# Patient Record
Sex: Female | Born: 1965 | Race: White | Hispanic: No | Marital: Married | Smoking: Never smoker
Health system: Southern US, Community
[De-identification: ages and names within clinical notes are randomized; demographics above are authoritative.]

## PROBLEM LIST (undated history)

## (undated) DIAGNOSIS — I10 Essential (primary) hypertension: Secondary | ICD-10-CM

## (undated) DIAGNOSIS — M5136 Other intervertebral disc degeneration, lumbar region: Secondary | ICD-10-CM

## (undated) DIAGNOSIS — K760 Fatty (change of) liver, not elsewhere classified: Secondary | ICD-10-CM

## (undated) DIAGNOSIS — M353 Polymyalgia rheumatica: Secondary | ICD-10-CM

## (undated) DIAGNOSIS — E785 Hyperlipidemia, unspecified: Secondary | ICD-10-CM

## (undated) DIAGNOSIS — E781 Pure hyperglyceridemia: Secondary | ICD-10-CM

## (undated) DIAGNOSIS — R748 Abnormal levels of other serum enzymes: Secondary | ICD-10-CM

## (undated) DIAGNOSIS — E559 Vitamin D deficiency, unspecified: Secondary | ICD-10-CM

## (undated) HISTORY — DX: Polymyalgia rheumatica: M35.3

## (undated) HISTORY — DX: Pure hyperglyceridemia: E78.1

## (undated) HISTORY — DX: Other intervertebral disc degeneration, lumbar region: M51.36

## (undated) HISTORY — DX: Fatty (change of) liver, not elsewhere classified: K76.0

## (undated) HISTORY — DX: Vitamin D deficiency, unspecified: E55.9

## (undated) HISTORY — DX: Abnormal levels of other serum enzymes: R74.8

## (undated) HISTORY — DX: Hyperlipidemia, unspecified: E78.5

## (undated) HISTORY — PX: WISDOM TOOTH EXTRACTION: SHX21

---

## 1997-11-08 ENCOUNTER — Other Ambulatory Visit: Admission: RE | Admit: 1997-11-08 | Discharge: 1997-11-08 | Payer: Self-pay | Admitting: Obstetrics and Gynecology

## 1999-01-03 ENCOUNTER — Other Ambulatory Visit: Admission: RE | Admit: 1999-01-03 | Discharge: 1999-01-03 | Payer: Self-pay | Admitting: Obstetrics and Gynecology

## 2000-01-25 ENCOUNTER — Other Ambulatory Visit: Admission: RE | Admit: 2000-01-25 | Discharge: 2000-01-25 | Payer: Self-pay | Admitting: Obstetrics and Gynecology

## 2001-03-13 ENCOUNTER — Other Ambulatory Visit: Admission: RE | Admit: 2001-03-13 | Discharge: 2001-03-13 | Payer: Self-pay | Admitting: Obstetrics and Gynecology

## 2002-05-07 ENCOUNTER — Other Ambulatory Visit: Admission: RE | Admit: 2002-05-07 | Discharge: 2002-05-07 | Payer: Self-pay | Admitting: Obstetrics and Gynecology

## 2002-09-23 ENCOUNTER — Encounter: Admission: RE | Admit: 2002-09-23 | Discharge: 2002-09-23 | Payer: Self-pay | Admitting: Obstetrics and Gynecology

## 2002-09-23 ENCOUNTER — Encounter: Payer: Self-pay | Admitting: Obstetrics and Gynecology

## 2003-07-02 ENCOUNTER — Other Ambulatory Visit: Admission: RE | Admit: 2003-07-02 | Discharge: 2003-07-02 | Payer: Self-pay | Admitting: Obstetrics and Gynecology

## 2004-10-04 ENCOUNTER — Other Ambulatory Visit: Admission: RE | Admit: 2004-10-04 | Discharge: 2004-10-04 | Payer: Self-pay | Admitting: Obstetrics and Gynecology

## 2004-10-16 ENCOUNTER — Encounter: Admission: RE | Admit: 2004-10-16 | Discharge: 2005-01-14 | Payer: Self-pay | Admitting: Obstetrics and Gynecology

## 2005-01-24 ENCOUNTER — Encounter: Admission: RE | Admit: 2005-01-24 | Discharge: 2005-02-25 | Payer: Self-pay | Admitting: Obstetrics and Gynecology

## 2005-11-02 ENCOUNTER — Other Ambulatory Visit: Admission: RE | Admit: 2005-11-02 | Discharge: 2005-11-02 | Payer: Self-pay | Admitting: Obstetrics and Gynecology

## 2006-10-23 ENCOUNTER — Encounter: Admission: RE | Admit: 2006-10-23 | Discharge: 2006-10-23 | Payer: Self-pay | Admitting: Obstetrics and Gynecology

## 2006-12-25 ENCOUNTER — Other Ambulatory Visit: Admission: RE | Admit: 2006-12-25 | Discharge: 2006-12-25 | Payer: Self-pay | Admitting: Obstetrics and Gynecology

## 2007-02-27 DIAGNOSIS — E559 Vitamin D deficiency, unspecified: Secondary | ICD-10-CM

## 2007-02-27 HISTORY — DX: Vitamin D deficiency, unspecified: E55.9

## 2007-12-08 ENCOUNTER — Other Ambulatory Visit: Admission: RE | Admit: 2007-12-08 | Discharge: 2007-12-08 | Payer: Self-pay | Admitting: Obstetrics and Gynecology

## 2007-12-12 ENCOUNTER — Encounter: Admission: RE | Admit: 2007-12-12 | Discharge: 2007-12-12 | Payer: Self-pay | Admitting: Obstetrics and Gynecology

## 2008-11-26 DIAGNOSIS — M5136 Other intervertebral disc degeneration, lumbar region: Secondary | ICD-10-CM

## 2008-11-26 DIAGNOSIS — M51369 Other intervertebral disc degeneration, lumbar region without mention of lumbar back pain or lower extremity pain: Secondary | ICD-10-CM

## 2008-11-26 HISTORY — DX: Other intervertebral disc degeneration, lumbar region without mention of lumbar back pain or lower extremity pain: M51.369

## 2008-11-26 HISTORY — DX: Other intervertebral disc degeneration, lumbar region: M51.36

## 2009-01-05 ENCOUNTER — Encounter: Admission: RE | Admit: 2009-01-05 | Discharge: 2009-01-05 | Payer: Self-pay | Admitting: Obstetrics and Gynecology

## 2010-01-06 ENCOUNTER — Encounter: Admission: RE | Admit: 2010-01-06 | Discharge: 2010-01-06 | Payer: Self-pay | Admitting: Obstetrics and Gynecology

## 2010-01-26 DIAGNOSIS — K760 Fatty (change of) liver, not elsewhere classified: Secondary | ICD-10-CM

## 2010-01-26 HISTORY — DX: Fatty (change of) liver, not elsewhere classified: K76.0

## 2010-05-03 ENCOUNTER — Encounter: Payer: BC Managed Care – PPO | Attending: Endocrinology | Admitting: *Deleted

## 2010-05-03 DIAGNOSIS — E669 Obesity, unspecified: Secondary | ICD-10-CM | POA: Insufficient documentation

## 2010-05-03 DIAGNOSIS — Z713 Dietary counseling and surveillance: Secondary | ICD-10-CM | POA: Insufficient documentation

## 2010-07-28 DIAGNOSIS — E785 Hyperlipidemia, unspecified: Secondary | ICD-10-CM

## 2010-07-28 HISTORY — DX: Hyperlipidemia, unspecified: E78.5

## 2010-08-04 ENCOUNTER — Other Ambulatory Visit: Payer: Self-pay | Admitting: Family Medicine

## 2010-08-04 DIAGNOSIS — R748 Abnormal levels of other serum enzymes: Secondary | ICD-10-CM

## 2010-08-07 ENCOUNTER — Ambulatory Visit
Admission: RE | Admit: 2010-08-07 | Discharge: 2010-08-07 | Disposition: A | Discharge status: Home / Self Care | Payer: BC Managed Care – PPO | Source: Ambulatory Visit | Attending: Family Medicine | Admitting: Family Medicine

## 2010-08-07 DIAGNOSIS — R748 Abnormal levels of other serum enzymes: Secondary | ICD-10-CM

## 2010-12-26 ENCOUNTER — Other Ambulatory Visit: Payer: Self-pay | Admitting: Obstetrics and Gynecology

## 2010-12-26 DIAGNOSIS — Z1231 Encounter for screening mammogram for malignant neoplasm of breast: Secondary | ICD-10-CM

## 2011-02-02 ENCOUNTER — Ambulatory Visit
Admission: RE | Admit: 2011-02-02 | Discharge: 2011-02-02 | Disposition: A | Discharge status: Home / Self Care | Payer: BC Managed Care – PPO | Source: Ambulatory Visit | Attending: Obstetrics and Gynecology | Admitting: Obstetrics and Gynecology

## 2011-02-02 DIAGNOSIS — Z1231 Encounter for screening mammogram for malignant neoplasm of breast: Secondary | ICD-10-CM

## 2011-03-28 ENCOUNTER — Other Ambulatory Visit (HOSPITAL_COMMUNITY): Payer: Self-pay | Admitting: Gastroenterology

## 2011-04-02 ENCOUNTER — Encounter (HOSPITAL_COMMUNITY): Payer: Self-pay | Admitting: Respiratory Therapy

## 2011-04-05 ENCOUNTER — Other Ambulatory Visit: Payer: Self-pay | Admitting: Radiology

## 2011-04-10 ENCOUNTER — Other Ambulatory Visit: Payer: Self-pay | Admitting: Radiology

## 2011-04-13 ENCOUNTER — Encounter (HOSPITAL_COMMUNITY): Payer: Self-pay

## 2011-04-13 ENCOUNTER — Ambulatory Visit (HOSPITAL_COMMUNITY)
Admission: RE | Admit: 2011-04-13 | Discharge: 2011-04-13 | Disposition: A | Discharge status: Home / Self Care | Payer: BC Managed Care – PPO | Source: Ambulatory Visit | Attending: Gastroenterology | Admitting: Gastroenterology

## 2011-04-13 DIAGNOSIS — K759 Inflammatory liver disease, unspecified: Secondary | ICD-10-CM | POA: Insufficient documentation

## 2011-04-13 DIAGNOSIS — I10 Essential (primary) hypertension: Secondary | ICD-10-CM | POA: Insufficient documentation

## 2011-04-13 DIAGNOSIS — R7989 Other specified abnormal findings of blood chemistry: Secondary | ICD-10-CM | POA: Insufficient documentation

## 2011-04-13 DIAGNOSIS — K7689 Other specified diseases of liver: Secondary | ICD-10-CM | POA: Insufficient documentation

## 2011-04-13 DIAGNOSIS — E119 Type 2 diabetes mellitus without complications: Secondary | ICD-10-CM | POA: Insufficient documentation

## 2011-04-13 HISTORY — DX: Essential (primary) hypertension: I10

## 2011-04-13 LAB — CBC
HCT: 37.6 % (ref 36.0–46.0)
MCV: 85.1 fL (ref 78.0–100.0)
Platelets: 304 10*3/uL (ref 150–400)
RBC: 4.42 MIL/uL (ref 3.87–5.11)
RDW: 14.2 % (ref 11.5–15.5)
WBC: 10.8 10*3/uL — ABNORMAL HIGH (ref 4.0–10.5)

## 2011-04-13 LAB — GLUCOSE, CAPILLARY: Glucose-Capillary: 100 mg/dL — ABNORMAL HIGH (ref 70–99)

## 2011-04-13 LAB — APTT: aPTT: 31 seconds (ref 24–37)

## 2011-04-13 LAB — PROTIME-INR: INR: 1 (ref 0.00–1.49)

## 2011-04-13 MED ORDER — SODIUM CHLORIDE 0.9 % IV SOLN
Freq: Once | INTRAVENOUS | Status: AC
  Administered 2011-04-13: 09:00:00 via INTRAVENOUS

## 2011-04-13 MED ORDER — FENTANYL CITRATE 0.05 MG/ML IJ SOLN
INTRAMUSCULAR | Status: AC
  Filled 2011-04-13: qty 4

## 2011-04-13 MED ORDER — MIDAZOLAM HCL 5 MG/5ML IJ SOLN
INTRAMUSCULAR | Status: DC | PRN
  Administered 2011-04-13: 2 mg via INTRAVENOUS
  Administered 2011-04-13: 1 mg via INTRAVENOUS

## 2011-04-13 MED ORDER — MIDAZOLAM HCL 2 MG/2ML IJ SOLN
INTRAMUSCULAR | Status: AC
  Filled 2011-04-13: qty 4

## 2011-04-13 MED ORDER — FENTANYL CITRATE 0.05 MG/ML IJ SOLN
INTRAMUSCULAR | Status: DC | PRN
  Administered 2011-04-13: 25 ug via INTRAVENOUS
  Administered 2011-04-13 (×2): 50 ug via INTRAVENOUS

## 2011-04-13 NOTE — H&P (Signed)
Meghan Lyons is an 46 y.o. female.   Chief Complaint: elevated liver function tests HPI: scheduled for liver core biopsy  Past Medical History  Diagnosis Date  . Hypertension   . Diabetes mellitus     History reviewed. No pertinent past surgical history.  History reviewed. No pertinent family history. Social History:  does not have a smoking history on file. She does not have any smokeless tobacco history on file. Her alcohol and drug histories not on file.  Allergies:  Allergies  Allergen Reactions  . Keflex Swelling and Rash    Medications Prior to Admission  Medication Sig Dispense Refill  . lisinopril-hydrochlorothiazide (PRINZIDE,ZESTORETIC) 20-25 MG per tablet Take 1 tablet by mouth daily.      . metFORMIN (GLUCOPHAGE) 500 MG tablet Take 500 mg by mouth 2 (two) times daily with a meal.      . naproxen sodium (ANAPROX) 220 MG tablet Take 220 mg by mouth 2 (two) times daily with a meal.      . norethindrone (MICRONOR,CAMILA,ERRIN) 0.35 MG tablet Take 1 tablet by mouth daily.      . Vitamin D, Ergocalciferol, (DRISDOL) 50000 UNITS CAPS Take 50,000 Units by mouth every 14 (fourteen) days.       Medications Prior to Admission  Medication Dose Route Frequency Provider Last Rate Last Dose  . 0.9 %  sodium chloride infusion   Intravenous Once Robet Leu, PA 20 mL/hr at 04/13/11 1191      No results found for this or any previous visit (from the past 48 hour(s)). No results found.  Review of Systems  Constitutional: Negative for fever.  Respiratory: Negative for cough.   Cardiovascular: Negative for chest pain.  Gastrointestinal: Negative for nausea, vomiting and abdominal pain.  Neurological: Negative for headaches.    Blood pressure 136/92, pulse 76, temperature 98 F (36.7 C), temperature source Oral, resp. rate 18, height 5\' 5"  (1.651 m), weight 217 lb (98.431 kg), SpO2 97.00%. Physical Exam  Constitutional: She is oriented to person, place, and time. She  appears well-developed and well-nourished.  HENT:  Head: Normocephalic.  Eyes: EOM are normal.  Neck: Normal range of motion.  Cardiovascular: Normal rate, regular rhythm and normal heart sounds.   No murmur heard. Respiratory: Effort normal and breath sounds normal. She has no wheezes.  GI: Soft. Bowel sounds are normal. There is no tenderness.  Musculoskeletal: Normal range of motion.  Neurological: She is alert and oriented to person, place, and time.  Skin: Skin is warm and dry.     Assessment/Plan Elevated liver functions Scheduled for liver core biopsy Pt aware of procedure benefits and risks and agreeable to proceed. Consent signed.  Olie Scaffidi A 04/13/2011, 9:43 AM

## 2011-04-13 NOTE — H&P (Signed)
Cosigning.

## 2011-04-13 NOTE — Progress Notes (Signed)
Beckey Downing PA advised of patients abdominal cramping.  Order for pain med received, but patient wanted to wait and see if she felt better after urinating.  1340 pt ambulated to bathroom, voided qs and states she is pain free.  She does not wish to take the pain medication.

## 2011-04-13 NOTE — Procedures (Signed)
Technically successful US guided biopsy of right lobe of liver.  No immediate complications.   

## 2011-04-13 NOTE — Discharge Instructions (Signed)
Liver Biopsy Care After Refer to this sheet in the next few weeks. These discharge instructions provide you with general information on caring for yourself after you leave the hospital. Your caregiver may also give you specific instructions. Your treatment has been planned according to the most current medical practices available, but unavoidable complications sometimes occur. If you have any problems or questions after discharge, please call your caregiver. HOME CARE INSTRUCTIONS   You should rest for 1 to 2 days or as instructed.   If you go home the same day as your procedure (outpatient), have a responsible adult take you home and stay with you overnight.   Do not lift more than 5 pounds or play contact sports for 2 weeks.   Do not drive for 24 hours after this test.   Do not take medicine containing aspirin or drink alcohol for 1 week after this test.   Change bandages (dressings) as directed.   Only take over-the-counter or prescription medicines for pain, discomfort, or fever as directed by your caregiver.  OBTAINING YOUR TEST RESULTS Not all test results are available during your visit. If your test results are not back during the visit, make an appointment with your caregiver to find out the results. Do not assume everything is normal if you have not heard from your caregiver or the medical facility. It is important for you to follow up on all of your test results. SEEK MEDICAL CARE IF:   You have increased bleeding (more than a small spot) from the biopsy site.   You have redness, swelling, or increasing pain in the biopsy site.   You have an oral temperature above 102 F (38.9 C).  SEEK IMMEDIATE MEDICAL CARE IF:   You develop swelling or pain in the belly (abdomen).   You develop a rash.   You have difficulty breathing, feel short of breath, or feel faint.   You develop any reaction or side effects to medicines given.  MAKE SURE YOU:   Understand these instructions.    Will watch your condition.   Will get help right away if you are not doing well or get worse.  Document Released: 09/01/2004 Document Revised: 10/25/2010 Document Reviewed: 09/25/2007 ExitCare Patient Information 2012 [employer]. 

## 2011-04-17 ENCOUNTER — Telehealth (HOSPITAL_COMMUNITY): Payer: Self-pay

## 2011-05-21 ENCOUNTER — Ambulatory Visit (INDEPENDENT_AMBULATORY_CARE_PROVIDER_SITE_OTHER): Payer: BC Managed Care – PPO | Admitting: Gastroenterology

## 2011-05-21 ENCOUNTER — Ambulatory Visit
Admission: RE | Admit: 2011-05-21 | Discharge: 2011-05-21 | Disposition: A | Discharge status: Home / Self Care | Payer: BC Managed Care – PPO | Source: Ambulatory Visit | Attending: Gastroenterology | Admitting: Gastroenterology

## 2011-05-21 ENCOUNTER — Other Ambulatory Visit: Payer: Self-pay | Admitting: Gastroenterology

## 2011-05-21 DIAGNOSIS — R945 Abnormal results of liver function studies: Secondary | ICD-10-CM

## 2011-06-28 ENCOUNTER — Other Ambulatory Visit: Payer: Self-pay | Admitting: Internal Medicine

## 2011-06-28 DIAGNOSIS — M25519 Pain in unspecified shoulder: Secondary | ICD-10-CM

## 2011-07-04 ENCOUNTER — Ambulatory Visit
Admission: RE | Admit: 2011-07-04 | Discharge: 2011-07-04 | Disposition: A | Discharge status: Home / Self Care | Payer: BC Managed Care – PPO | Source: Ambulatory Visit | Attending: Internal Medicine | Admitting: Internal Medicine

## 2011-07-04 DIAGNOSIS — M25519 Pain in unspecified shoulder: Secondary | ICD-10-CM

## 2011-07-16 ENCOUNTER — Other Ambulatory Visit: Payer: Self-pay | Admitting: Gastroenterology

## 2011-07-16 DIAGNOSIS — R945 Abnormal results of liver function studies: Secondary | ICD-10-CM

## 2011-07-19 ENCOUNTER — Other Ambulatory Visit: Payer: BC Managed Care – PPO

## 2011-07-26 ENCOUNTER — Ambulatory Visit
Admission: RE | Admit: 2011-07-26 | Discharge: 2011-07-26 | Disposition: A | Discharge status: Home / Self Care | Payer: BC Managed Care – PPO | Source: Ambulatory Visit | Attending: Gastroenterology | Admitting: Gastroenterology

## 2011-07-26 DIAGNOSIS — R945 Abnormal results of liver function studies: Secondary | ICD-10-CM

## 2011-07-26 MED ORDER — GADOBENATE DIMEGLUMINE 529 MG/ML IV SOLN
20.0000 mL | Freq: Once | INTRAVENOUS | Status: AC | PRN
  Administered 2011-07-26: 20 mL via INTRAVENOUS

## 2012-01-07 ENCOUNTER — Other Ambulatory Visit: Payer: Self-pay | Admitting: Obstetrics and Gynecology

## 2012-01-07 DIAGNOSIS — Z1231 Encounter for screening mammogram for malignant neoplasm of breast: Secondary | ICD-10-CM

## 2012-02-22 ENCOUNTER — Ambulatory Visit
Admission: RE | Admit: 2012-02-22 | Discharge: 2012-02-22 | Disposition: A | Discharge status: Home / Self Care | Payer: BC Managed Care – PPO | Source: Ambulatory Visit | Attending: Obstetrics and Gynecology | Admitting: Obstetrics and Gynecology

## 2012-02-22 DIAGNOSIS — Z1231 Encounter for screening mammogram for malignant neoplasm of breast: Secondary | ICD-10-CM

## 2012-02-22 LAB — HM MAMMOGRAPHY

## 2012-06-10 ENCOUNTER — Ambulatory Visit (INDEPENDENT_AMBULATORY_CARE_PROVIDER_SITE_OTHER): Payer: BC Managed Care – PPO | Admitting: Nurse Practitioner

## 2012-06-10 ENCOUNTER — Encounter: Payer: Self-pay | Admitting: Nurse Practitioner

## 2012-06-10 ENCOUNTER — Telehealth: Payer: Self-pay | Admitting: Nurse Practitioner

## 2012-06-10 VITALS — BP 110/76 | HR 72 | Resp 16 | Ht 65.0 in | Wt 183.0 lb

## 2012-06-10 DIAGNOSIS — N921 Excessive and frequent menstruation with irregular cycle: Secondary | ICD-10-CM

## 2012-06-10 MED ORDER — NORETHINDRONE ACETATE 5 MG PO TABS: 5.0000 mg | ORAL_TABLET | ORAL | Status: DC

## 2012-06-10 MED ORDER — FLUCONAZOLE 150 MG PO TABS: 150.0000 mg | ORAL_TABLET | Freq: Once | ORAL | Status: DC

## 2012-06-10 NOTE — Telephone Encounter (Signed)
Left message for patient to return call and make appt. Today. sue

## 2012-06-10 NOTE — Telephone Encounter (Signed)
Patient called to as about medication for yeast infection. Thinks it might not have gotten sent in due to talking about the other medicaiton for bleeding. Please send to [facility]. Fannie Knee  ?Diflucaon

## 2012-06-10 NOTE — Patient Instructions (Addendum)
Aygestin 5 mg   DIRECTIONS:    3 tabs am and pm until bleeding stops Then 10 mg (2 tabs) am and pm for 2 days Then 10mg  (2 tabs) daily until pelvic ultrasound   Continue with Micronor daily as usual  You will get a call about Pelvic Ultrasound Scheduling  HGB 12.5

## 2012-06-10 NOTE — Telephone Encounter (Signed)
Thanks Sue

## 2012-06-10 NOTE — Progress Notes (Signed)
Subjective:     Patient ID: Meghan Lyons, female   DOB: 09/11/1965, 47 y.o.   MRN: [id number]  HPI Comments: Patient had 1 week early  menses on 3/24- 05/30/12 on Micronor POP.  She then spotted after sexual activity about April 7 th with light bleeding X 1 day. They had been working outside trying to clean up fallen debris after the ice strom and she felt very itchy.  She then treated yeast symptoms with OTC Monistat 1 day treatment.  Then started bleeding again now X 4 days.  Usually on Micronor has cycle every 28 days lasting 6 days.  Heavy for 2 days then light.    Review of Systems  Constitutional: Positive for activity change.       Outside working to clean up fallen debris  Respiratory: Negative.   Cardiovascular: Negative.   Gastrointestinal: Negative.  Negative for nausea, vomiting, abdominal pain, diarrhea, constipation, blood in stool and abdominal distention.  Genitourinary: Positive for vaginal bleeding and menstrual problem. Negative for dysuria, urgency, frequency, difficulty urinating, genital sores, vaginal pain, pelvic pain and dyspareunia.       Menses usually on POP is regular. Changed to Micronor 01/2010 secondary to elevated BP. On OCP menses were moderate to light. Since then heavier flow. No missed or late pills.  Musculoskeletal: Positive for back pain.       History of low back pain and MRI in past shows DDD with nerve root irritation.  Most recently l st of year had numbness top of left leg.  Back pain has not changed in character with this bleeding pattern.  Neurological: Positive for numbness.       Objective:   Physical Exam  Constitutional: She is oriented to person, place, and time. She appears well-developed and well-nourished.  Cardiovascular: Normal rate.   Pulmonary/Chest: Effort normal and breath sounds normal.  Abdominal: Soft. She exhibits no distension and no mass. There is no tenderness. There is no rebound and no guarding.  Genitourinary:   Moderate to heavy vaginal bleeding with clots.  No obvious yeast infection but wet prep is not done due to heavy bleeding. Uterus is not enlarged and no pain on bimanual exam.  Neurological: She is alert and oriented to person, place, and time.  Skin: Skin is warm and dry.  Psychiatric: She has a normal mood and affect. Her behavior is normal. Judgment and thought content normal.   UPT neg HGB 12.5    Assessment:      AUB on Progesterone only pills  Recent yeast vaginitis    Plan:      Consulted with Dr. Leda Quail  Will start on Aygestin 5 mg. 3 tabs BID until bleeding stops, then 10 mg am and pm for 2 days, then 10 mg daily until PUS.    Continue with Micronor daily.  Will add Diflucan X 2 tabs as directed to help with yeast symptoms  Will schedule PUS and follow

## 2012-06-10 NOTE — Telephone Encounter (Signed)
pt thinks she may have a yeast infection and she is having some  bleeding

## 2012-06-10 NOTE — Telephone Encounter (Signed)
Patient has a question about prescription from visit.

## 2012-06-10 NOTE — Telephone Encounter (Signed)
Medication for diflucon sent to pharmacy. Patient notified. sue

## 2012-06-11 ENCOUNTER — Telehealth: Payer: Self-pay | Admitting: *Deleted

## 2012-06-11 NOTE — Telephone Encounter (Signed)
Spoke with pharmacy.  Megace 20mg  bid until bleeding stops, then qd called in.  Left msg for pt.  Cost is much lower.

## 2012-06-11 NOTE — Telephone Encounter (Signed)
Patient saw Shirlyn Goltz yesterday for bleeding and yeast inf. pateint called today and states medication , Aygestin 5mg  cost $120.00 for the amount of 60. Ask could something else be called in or a less amount which might make cost less>? Please advise. sue

## 2012-06-11 NOTE — Addendum Note (Signed)
Addended by: Jerene Bears on: 06/11/2012 06:17 PM   Modules accepted: Orders

## 2012-06-12 LAB — HEMOGLOBIN, FINGERSTICK: Hemoglobin, fingerstick: 12.5 g/dL (ref 12.0–16.0)

## 2012-06-12 NOTE — Progress Notes (Signed)
Encounter reviewed by Dr. Brook Silva.  

## 2012-06-16 NOTE — Telephone Encounter (Signed)
Returning [name]'s call to schedule an ultrasound/[facility] email also sent to [employer] with MRN#/Golden

## 2012-06-23 ENCOUNTER — Other Ambulatory Visit: Payer: Self-pay | Admitting: Obstetrics and Gynecology

## 2012-06-23 DIAGNOSIS — N921 Excessive and frequent menstruation with irregular cycle: Secondary | ICD-10-CM

## 2012-06-25 ENCOUNTER — Ambulatory Visit (INDEPENDENT_AMBULATORY_CARE_PROVIDER_SITE_OTHER): Payer: BC Managed Care – PPO | Admitting: Obstetrics and Gynecology

## 2012-06-25 ENCOUNTER — Encounter: Payer: Self-pay | Admitting: Obstetrics and Gynecology

## 2012-06-25 ENCOUNTER — Ambulatory Visit (INDEPENDENT_AMBULATORY_CARE_PROVIDER_SITE_OTHER): Payer: BC Managed Care – PPO

## 2012-06-25 VITALS — BP 120/74

## 2012-06-25 DIAGNOSIS — N921 Excessive and frequent menstruation with irregular cycle: Secondary | ICD-10-CM

## 2012-06-25 DIAGNOSIS — N920 Excessive and frequent menstruation with regular cycle: Secondary | ICD-10-CM

## 2012-06-25 NOTE — Patient Instructions (Addendum)

## 2012-06-25 NOTE — Progress Notes (Signed)
Subjective  Patient here for pelvic ultrasound.  47 year old G2P2 female presents in follow up for heavy vaginal bleeding episode.  Patient has been on Camilla OCP since 2011 for contraception.  Started Camilla due to HTN.  Menses in February was prolonged, lasting 8 days.  Then in March lasted 2 weeks.  Had a week of no bleeding and then started heavy bleeding following intercourse.  No missed or late pills.  Had heavy bleeding and was seen in office and was treated with Megace.  This stopped the bleeding.  Patient states some difficulty in initiating urine stream, which coincided with bleeding.  This has essentially resolved.   Has herniated disc of the L4 and 5.  Notes increased back pain.  Was worried this is related to the bleeding episode.  Objective  See ultrasound findings below.  Normal uterus and ovaries.  Assessment  Episode of menorrhagia on Camilla OCP. Normal pelvic ultrasound with thin endometrial lining. OK to stop Megace.  Patient knows that she may expect some bleeding. OK to continue with [facility] If heavy bleeding recurs, needs endometrial biopsy. I discussed a progesterone IUD, endometrial ablation, and hysterectomy as possible treatment options if bleeding recurs and has a normal future endometrial biopsy.

## 2012-08-15 ENCOUNTER — Ambulatory Visit (INDEPENDENT_AMBULATORY_CARE_PROVIDER_SITE_OTHER): Payer: BC Managed Care – PPO | Admitting: Obstetrics & Gynecology

## 2012-08-15 VITALS — BP 140/80 | HR 84 | Resp 16 | Ht 65.0 in | Wt 183.2 lb

## 2012-08-15 DIAGNOSIS — N920 Excessive and frequent menstruation with regular cycle: Secondary | ICD-10-CM

## 2012-08-15 NOTE — Patient Instructions (Addendum)

## 2012-08-16 ENCOUNTER — Encounter: Payer: Self-pay | Admitting: Obstetrics & Gynecology

## 2012-08-16 DIAGNOSIS — N920 Excessive and frequent menstruation with regular cycle: Secondary | ICD-10-CM | POA: Insufficient documentation

## 2012-08-16 NOTE — Progress Notes (Signed)
47 y.o. Married [place] female G2P2 here with three months history of heavy bleeding during cycles.  She is on micronor for birth control but she does not think it is working well for her.  She does have a history of hypertension and of diabetes.  She has lost 40 pounds over the last several months and reports she may be taken off her metformin.  She was seen in April by Dr. Edward Jolly due to this bleeding.  An ultrasound was performed.  No abnormal findings were noted.  She was started on Megace which helped her cycle.  She used the Megace again in May and then June.  She called again to report the heavy bleeding and appointment was recommended.  She has mild cramping with cycles.  She has no urinary symptoms and no bowel issues.  She denies fever, vaginal discharge or pelvic pain.  Patient is sexually active.    ROS: All ROS questions are negative except as per HPI  Past Medical History  Diagnosis Date  . Hypertension   . Fatty liver 12/11    bx revealed--granuloma  . Hyperlipidemia 07/2010  . Vitamin D deficiency disease 2009  . DDD (degenerative disc disease), lumbar 11/2008  . Diabetes mellitus 03/2010    Dr. Talmage Nap  . Polymyalgia     Exam:   BP 140/80  Pulse 84  Resp 16  Ht 5\' 5"  (1.651 m)  Wt 183 lb 3.2 oz (83.099 kg)  BMI 30.49 kg/m2  LMP 08/07/2012    General appearance: alert and cooperative Abdomen:abdomen is soft without significant tenderness, masses, organomegaly or guarding Skin: Skin color, texture, turgor normal. Lymph nodes: No abnormal inguinal nodes palpated Neurologic: Grossly normal  Pelvic: Pelvic Exam Female: External genitalia: normal general appearance Vaginal: normal mucosa without prolapse or lesions Cervix: normal appearance Adnexa: normal bimanual exam Uterus: normal single, nontender  Endometrial biopsy recommended.  Discussed with patient.  Risks discussed.  Verbal consent obtained.   Procedure:  Speculum placed.  Cervix visualized and cleansed with  betadine prep.  A single toothed tenaculum was applied to the anterior lip of the cervix.  Dilation of cervix was required.  Anterior lip of cervix grasped with single toothed tenaculum at 12 o'clock.  Cervix dilated with Milex dilator.  Endometrial pipelle was advanced through the cervix into the endometrial cavity without difficulty.  Pipelle passed to 7.5cm.  Suction applied and pipelle removed with good tissue sample obtained.  Sampling performed twice. Tenculum removed.  No bleeding noted.  Patient tolerated procedure well.  Assessment:  menorrhagia on micronor with normal ultrasound  Plan:  endometrial biopsy results pending.  Patient does have Megace on hand at home if bleeding starts again before biopsy results are finalized.

## 2012-09-24 ENCOUNTER — Telehealth: Payer: Self-pay | Admitting: Obstetrics & Gynecology

## 2012-09-24 NOTE — Telephone Encounter (Signed)
Patient needs her Megestrol refilled .   [facility] [phone]

## 2012-09-25 ENCOUNTER — Other Ambulatory Visit: Payer: Self-pay

## 2012-09-25 MED ORDER — MEGESTROL ACETATE 20 MG PO TABS: 20.0000 mg | ORAL_TABLET | Freq: Every day | ORAL | Status: DC

## 2012-09-25 NOTE — Telephone Encounter (Signed)
Pt requesting magestrol acetate 40mg . Pt was on 20mg  bid until bleeding stops & then daily per note in chart by triage nurse. Then had endo bx done by Leda Quail on 08/15/12 & report came back. Per note in epic it just said that patient would decide about her options but no further info. Please approve or deny rx.Not sure what dosage patient should be taking if she should be taking it at all. Last aex was 02/11/12

## 2012-09-25 NOTE — Telephone Encounter (Signed)
2nd note dated 09/25/12 routed to Dr. Hyacinth Meeker.  Closing this encounter.

## 2012-09-25 NOTE — Telephone Encounter (Signed)
Called patient.  Rx to pharmacy.  She is going to call when she gets back from beach and scheduled endometrial ablation.

## 2012-09-25 NOTE — Telephone Encounter (Signed)
Patient called regarding Prescription . Leaving for the beach tomorrow. Wanted to know if it had been called in .

## 2012-09-30 ENCOUNTER — Telehealth: Payer: Self-pay | Admitting: *Deleted

## 2012-09-30 NOTE — Telephone Encounter (Signed)
Fax From: [facility] for Megestrol Acetate 40 mg 09/25/12 Megestrol 20 mg #30/0rfs was sent  Aex Scheduled: 02/12/13  S/w "Belenda Cruise" at [facility] she said she hadn't received that rx gave them a verbal rx over the phone  Megestrol 20 mg 1 p.o daily #30/0rf's

## 2012-10-06 ENCOUNTER — Telehealth: Payer: Self-pay | Admitting: *Deleted

## 2012-10-06 NOTE — Telephone Encounter (Signed)
Call to patient to discuss date preferences for procedure. LMTCB.

## 2012-10-10 ENCOUNTER — Other Ambulatory Visit: Payer: Self-pay | Admitting: Obstetrics & Gynecology

## 2012-10-10 DIAGNOSIS — N920 Excessive and frequent menstruation with regular cycle: Secondary | ICD-10-CM

## 2012-10-14 NOTE — Telephone Encounter (Signed)
Call to patient to see about dates requested for surgery.  She states she wants to wait until [facility] appt this Thursday with Dr Hyacinth Meeker and then decide.

## 2012-10-16 ENCOUNTER — Encounter: Payer: Self-pay | Admitting: Obstetrics & Gynecology

## 2012-10-16 ENCOUNTER — Other Ambulatory Visit: Payer: Self-pay | Admitting: Obstetrics & Gynecology

## 2012-10-16 ENCOUNTER — Ambulatory Visit (INDEPENDENT_AMBULATORY_CARE_PROVIDER_SITE_OTHER): Payer: BC Managed Care – PPO

## 2012-10-16 ENCOUNTER — Ambulatory Visit (INDEPENDENT_AMBULATORY_CARE_PROVIDER_SITE_OTHER): Payer: BC Managed Care – PPO | Admitting: Obstetrics & Gynecology

## 2012-10-16 DIAGNOSIS — N920 Excessive and frequent menstruation with regular cycle: Secondary | ICD-10-CM

## 2012-10-16 NOTE — Progress Notes (Signed)
47 y.o.Marriedfemale here for a pelvic ultrasound with sonohystogram.  Patient has hx of menorrhagia that has worsened over the past several months.  She had a PUS with Dr. Edward Jolly in April.  She returned for an endometrial biopsy with me on 08/15/12 which was negative.  She is using Megace during her cycle on the heaviest days in addition to [employer].  She has not yet decided about treatment options but is leaning towards an ablation.    Indication: menorrhagia  Contraception:  micronor  Technique:  Both transabdominal and transvaginal ultrasound  examinations of the pelvis were performed. Transabdominal technique  was performed for global imaging of the pelvis including uterus,  ovaries, adnexal regions, and pelvic cul-de-sac.  It was necessary to proceed with endovaginal exam following the abdominal ultrasound transabdominal exam to visualize the endometrium and adnexa.  Color and duplex Doppler ultrasound was utilized to evaluate blood  flow to the ovaries.    FINDINGS: UTERUS: 7.0 x 3.8 x 3.0cm EMS: 3.13mm ADNEXA: left ovary 2.3 x 1.3 x 1.5cm, right ovary 2.0 x 0.9 x 0.9cm CUL DE SAC: neg  SHSG:  After obtaining appropriate verbal consent from patient, the cervix was visualized using a speculum, and prepped with betadine.  A tenaculum  was not applied to the cervix.  Dilation of the cervix was not necessary. The catheter was passed into the uterus and sterile saline introduced, with the following findings: no intracavitary lesions noted  D/W pt findings.  She really has not decided what to do for treatment.  Endometrial ablation, hospital stay, recovery, risks including failure rate, need for BTL, DVT/PE, uterine perforation with risk of bowel/bladder/ureteral injury all discussed.  IUD use, placement, length of use, risks discussed.  Hysterectomy discussed including hospital stay, risks, recovery/return to work.  She was given brochures again today.  She will consider and let me know.     Assessment:  Menorrhagia, normal u/s, h/o Cearean section x 2.  Need for BC.  Plan:  Patient is deciding on options.  We will check IUD coverage and let pt know.  ~25 minutes spent with patient >50% of time was in face to face discussion of above.

## 2012-10-16 NOTE — Patient Instructions (Addendum)
Please call once you decide what you want to do for bleeding.

## 2012-11-12 ENCOUNTER — Other Ambulatory Visit: Payer: Self-pay | Admitting: Neurosurgery

## 2012-11-12 DIAGNOSIS — M4310 Spondylolisthesis, site unspecified: Secondary | ICD-10-CM

## 2012-11-13 ENCOUNTER — Ambulatory Visit
Admission: RE | Admit: 2012-11-13 | Discharge: 2012-11-13 | Disposition: A | Discharge status: Home / Self Care | Payer: BC Managed Care – PPO | Source: Ambulatory Visit | Attending: Neurosurgery | Admitting: Neurosurgery

## 2012-11-13 DIAGNOSIS — M4310 Spondylolisthesis, site unspecified: Secondary | ICD-10-CM

## 2012-11-19 HISTORY — PX: LUMBAR SPINE SURGERY: SHX701

## 2012-11-25 ENCOUNTER — Telehealth: Payer: Self-pay | Admitting: Orthopedic Surgery

## 2012-11-25 NOTE — Telephone Encounter (Signed)
Spoke with pt to inquire if she might like to see Dr. Hyacinth Meeker for her upcoming Aex in December. Pt had expressed interest in having an endometrial ablation done back in July, and Dr. Hyacinth Meeker thought it might be a good idea for her to become more familiar with her in case she decides to have it done. Pt says she will think about it and let us know. "I really love Patty, although Dr. Hyacinth Meeker is sweet too." Advised we have appts available on the same day for SM. Pt will let us know soon.

## 2013-01-01 ENCOUNTER — Other Ambulatory Visit: Payer: Self-pay

## 2013-02-02 ENCOUNTER — Other Ambulatory Visit: Payer: Self-pay

## 2013-02-02 DIAGNOSIS — Z1231 Encounter for screening mammogram for malignant neoplasm of breast: Secondary | ICD-10-CM

## 2013-02-05 ENCOUNTER — Telehealth: Payer: Self-pay | Admitting: Obstetrics & Gynecology

## 2013-02-05 NOTE — Telephone Encounter (Signed)
LMTCB to schedule IUD insertion

## 2013-02-05 NOTE — Telephone Encounter (Signed)
Patient was to have a Mirena IUD inserted but is no longer experiencing symptoms and has decided to hold off.

## 2013-02-09 ENCOUNTER — Telehealth: Payer: Self-pay | Admitting: *Deleted

## 2013-02-09 NOTE — Telephone Encounter (Signed)
Call to patient to discuss having AEX with Dr Hyacinth Meeker so she could also follow up on cycle issues since patient had previously wanted Novasure. LMTCB.

## 2013-02-12 ENCOUNTER — Encounter: Payer: Self-pay | Admitting: Nurse Practitioner

## 2013-02-12 ENCOUNTER — Ambulatory Visit (INDEPENDENT_AMBULATORY_CARE_PROVIDER_SITE_OTHER): Payer: BC Managed Care – PPO | Admitting: Nurse Practitioner

## 2013-02-12 VITALS — BP 136/88 | HR 64 | Resp 16 | Ht 65.25 in | Wt 199.0 lb

## 2013-02-12 DIAGNOSIS — Z0000 Encounter for general adult medical examination without abnormal findings: Secondary | ICD-10-CM

## 2013-02-12 DIAGNOSIS — E559 Vitamin D deficiency, unspecified: Secondary | ICD-10-CM

## 2013-02-12 DIAGNOSIS — R339 Retention of urine, unspecified: Secondary | ICD-10-CM

## 2013-02-12 DIAGNOSIS — Z01419 Encounter for gynecological examination (general) (routine) without abnormal findings: Secondary | ICD-10-CM

## 2013-02-12 LAB — POCT URINALYSIS DIPSTICK
Bilirubin, UA: NEGATIVE
Glucose, UA: NEGATIVE
Ketones, UA: NEGATIVE
Leukocytes, UA: NEGATIVE
Nitrite, UA: NEGATIVE
pH, UA: 5

## 2013-02-12 LAB — HEMOGLOBIN, FINGERSTICK: Hemoglobin, fingerstick: 12.1 g/dL (ref 12.0–16.0)

## 2013-02-12 MED ORDER — VITAMIN D (ERGOCALCIFEROL) 1.25 MG (50000 UNIT) PO CAPS: 50000.0000 [IU] | ORAL_CAPSULE | ORAL | Status: DC

## 2013-02-12 MED ORDER — MEGESTROL ACETATE 20 MG PO TABS: 20.0000 mg | ORAL_TABLET | Freq: Every day | ORAL | Status: DC | PRN

## 2013-02-12 MED ORDER — NORETHINDRONE 0.35 MG PO TABS: 1.0000 | ORAL_TABLET | Freq: Every day | ORAL | Status: DC

## 2013-02-12 NOTE — Telephone Encounter (Signed)
Patient never returned call and came today for AEX with Patty.  Any further follow-up regarding Novasure?  Pre-EPIC chart to your office.

## 2013-02-12 NOTE — Patient Instructions (Signed)

## 2013-02-12 NOTE — Progress Notes (Signed)
Reviewed personally.  M. Suzanne Aniaya Bacha, MD.  

## 2013-02-12 NOTE — Telephone Encounter (Signed)
No.  Looks like cycles are normal again.  She knows to call if bleeding changes.  Thanks.  Encounter closed.

## 2013-02-12 NOTE — Progress Notes (Signed)
Patient ID: Meghan Lyons, female   DOB: May 09, 1965, 47 y.o.   MRN: [id number] 47 y.o. G2P2 Married Caucasian Fe here for annual exam.  Menses have been heavy for several months starting in March through September.  Menses was irregular with prolonged bleeding and heavy cycles.  Had clots without cramps.  She was treated with Megace and PUS was normal.  She was anticipating HTA.  She then had back surgery in September with fusion of L 4-5.   Since then menses is regular at 5 days; no clots, no heavy bleeding, no cramps, no PMS. Slight vaso symptoms that are tolerable.  She is still on Micronor but wants a refill of Megace to have on hand.  She has history of incomplete emptying of her bladder and will have asymptomatic UTI - wants urine rechecked.  Had UTI after surgery.  Patient's last menstrual period was 02/09/2013.          Sexually active: yes  The current method of family planning is POP (progesterone only).     Exercising: yes  Home exercise routine includes walking and yard work.  Also PT following back surgery.  Limited due to recent back surgery.. Smoker:  no  Health Maintenance: Pap:  02/11/12, WNL, neg HR HPV MMG: 02/22/12, Bi-Rads 1: negative TDaP:  2013 Labs:  HB: 12.1 Urine: trace RBC   reports that she has never smoked. She has never used smokeless tobacco. She reports that she drinks alcohol. She reports that she does not use illicit drugs.  Past Medical History  Diagnosis Date  . Hypertension   . Fatty liver 12/11    bx revealed--granuloma  . Hyperlipidemia 07/2010  . Vitamin D deficiency disease 2009  . DDD (degenerative disc disease), lumbar 11/2008  . Diabetes mellitus 03/2010    Dr. Talmage Nap  . Polymyalgia     Past Surgical History  Procedure Laterality Date  . Cesarean section  10/89 & 5/96  . Lumbar spine surgery  11/19/2012    L4-L5 fusion  . Wisdom tooth extraction  age 67    Current Outpatient Prescriptions  Medication Sig Dispense Refill  . b complex  vitamins tablet Take 1 tablet by mouth daily.      . calcium carbonate (OS-CAL) 600 MG TABS Take 600 mg by mouth 2 (two) times daily with a meal.      . ibuprofen (ADVIL,MOTRIN) 200 MG tablet Take 400 mg by mouth at bedtime.      Marland Kitchen lisinopril-hydrochlorothiazide (PRINZIDE,ZESTORETIC) 20-25 MG per tablet Take 1 tablet by mouth daily.      . megestrol (MEGACE) 20 MG tablet Take 1 tablet (20 mg total) by mouth daily as needed (bleeding).  30 tablet  1  . metFORMIN (GLUCOPHAGE) 500 MG tablet Take 500 mg by mouth 2 (two) times daily with a meal.      . norethindrone (MICRONOR,CAMILA,ERRIN) 0.35 MG tablet Take 1 tablet (0.35 mg total) by mouth daily.  3 Package  3  . POTASSIUM PO Take by mouth 2 (two) times daily.      . TRAMADOL HCL PO Take 50 tablets by mouth 2 (two) times daily.      . vitamin C (ASCORBIC ACID) 500 MG tablet Take 500 mg by mouth daily.      . Vitamin D, Ergocalciferol, (DRISDOL) 50000 UNITS CAPS capsule Take 1 capsule (50,000 Units total) by mouth every 14 (fourteen) days.  30 capsule  3  . [DISCONTINUED] norethindrone (AYGESTIN) 5 MG tablet Take 1 tablet (  5 mg total) by mouth as directed. Take as directed  60 tablet  0   No current facility-administered medications for this visit.    Family History  Problem Relation Age of Onset  . Thyroid disease Mother   . Hypertension Mother   . Diabetes Maternal Grandmother   . Coronary artery disease Maternal Grandmother   . Coronary artery disease Father   . Hypertension Sister   . Coronary artery disease Maternal Grandfather   . Rheum arthritis Paternal Grandmother     ROS:  Pertinent items are noted in HPI.  Otherwise, a comprehensive ROS was negative.  Exam:   BP 136/88  Pulse 64  Resp 16  Ht 5' 5.25" (1.657 m)  Wt 199 lb (90.266 kg)  BMI 32.88 kg/m2  LMP 02/09/2013 Height: 5' 5.25" (165.7 cm)  Ht Readings from Last 3 Encounters:  02/12/13 5' 5.25" (1.657 m)  08/15/12 5\' 5"  (1.651 m)  06/10/12 5\' 5"  (1.651 m)     General appearance: alert, cooperative and appears stated age Head: Normocephalic, without obvious abnormality, atraumatic Neck: no adenopathy, supple, symmetrical, trachea midline and thyroid normal to inspection and palpation Lungs: clear to auscultation bilaterally Breasts: normal appearance, no masses or tenderness Heart: regular rate and rhythm Abdomen: soft, non-tender; no masses,  no organomegaly Extremities: extremities normal, atraumatic, no cyanosis or edema Skin: Skin color, texture, turgor normal. No rashes or lesions Lymph nodes: Cervical, supraclavicular, and axillary nodes normal. No abnormal inguinal nodes palpated Neurologic: Grossly normal   Pelvic: External genitalia:  no lesions              Urethra:  normal appearing urethra with no masses, tenderness or lesions              Bartholin's and Skene's: normal                 Vagina: normal appearing vagina with normal color and very light brown menstrual discharge, no lesions              Cervix: anteverted              Pap taken: no Bimanual Exam:  Uterus:  normal size, contour, position, consistency, mobility, non-tender              Adnexa: no mass, fullness, tenderness               Rectovaginal: Confirms   A:  Well Woman with normal exam  POP for contraception and menses regulation  Recent history of AUB - that has now resolved  S/P Lumbar fusion L 4-5 with ORIF 10/2012  R/O UTI  History of Vit D deficiency, DM, HTN  P:   Pap smear as per guidelines Not done  Mammogram is scheduled for January  Refill on Micronor for a year  Refilled on Megace with the understanding that if she has another episode of AUB and needs to start med's that she also call us.  She is agreeable.  Refill on Vit D and will follow with labs  Will follow with urine C&S - no treatment started  Counseled on breast self exam, mammography screening, adequate intake of calcium and vitamin D, diet and exercise return annually or  prn  An After Visit Summary was printed and given to the patient.

## 2013-02-13 LAB — URINE CULTURE
Colony Count: NO GROWTH
Organism ID, Bacteria: NO GROWTH

## 2013-02-13 LAB — VITAMIN D 25 HYDROXY (VIT D DEFICIENCY, FRACTURES): Vit D, 25-Hydroxy: 52 ng/mL (ref 30–89)

## 2013-03-09 ENCOUNTER — Ambulatory Visit
Admission: RE | Admit: 2013-03-09 | Discharge: 2013-03-09 | Disposition: A | Discharge status: Home / Self Care | Payer: BC Managed Care – PPO | Source: Ambulatory Visit

## 2013-03-09 DIAGNOSIS — Z1231 Encounter for screening mammogram for malignant neoplasm of breast: Secondary | ICD-10-CM

## 2013-12-28 ENCOUNTER — Encounter: Payer: Self-pay | Admitting: Nurse Practitioner

## 2014-01-11 ENCOUNTER — Other Ambulatory Visit: Payer: Self-pay | Admitting: *Deleted

## 2014-01-11 MED ORDER — NORETHINDRONE 0.35 MG PO TABS: 1.0000 | ORAL_TABLET | Freq: Every day | ORAL | Status: DC

## 2014-01-11 NOTE — Telephone Encounter (Signed)
Incoming fax from [facility] requesting Micronor  Last AEX and refill 02/12/13 #3packs/[id number] Next appt 03/01/14  Rx approved #[id number] Refills

## 2014-02-03 ENCOUNTER — Encounter: Payer: Self-pay | Admitting: Nurse Practitioner

## 2014-02-03 ENCOUNTER — Ambulatory Visit (INDEPENDENT_AMBULATORY_CARE_PROVIDER_SITE_OTHER): Payer: BC Managed Care – PPO | Admitting: Nurse Practitioner

## 2014-02-03 VITALS — BP 120/82 | HR 88 | Temp 98.2°F | Ht 65.25 in | Wt 205.0 lb

## 2014-02-03 DIAGNOSIS — R300 Dysuria: Secondary | ICD-10-CM

## 2014-02-03 LAB — POCT URINALYSIS DIPSTICK
Bilirubin, UA: NEGATIVE
Glucose, UA: NEGATIVE
KETONES UA: NEGATIVE
NITRITE UA: POSITIVE
PH UA: 5
PROTEIN UA: NEGATIVE
Urobilinogen, UA: NEGATIVE

## 2014-02-03 MED ORDER — PHENAZOPYRIDINE HCL 200 MG PO TABS: 200.0000 mg | ORAL_TABLET | Freq: Three times a day (TID) | ORAL | Status: DC | PRN

## 2014-02-03 MED ORDER — NITROFURANTOIN MONOHYD MACRO 100 MG PO CAPS: 100.0000 mg | ORAL_CAPSULE | Freq: Two times a day (BID) | ORAL | Status: DC

## 2014-02-03 MED ORDER — FLUCONAZOLE 150 MG PO TABS: 150.0000 mg | ORAL_TABLET | Freq: Once | ORAL | Status: DC

## 2014-02-03 NOTE — Progress Notes (Signed)
S:  48 y.o.Married female presents with complaint of UTI. Symptoms began on  Last pm at 10 pm.   With symptoms of blood in urine, burning with urination, nighttime incontinence, urinary frequency, urinary urgency, lower abdomnila pressure.  The patient is having constitutional symptoms, including chills last pm.   Sexually active with in the past few days.    Current method of birth control is POP.   No Vaginal dryness.  Married and monogamous.    Last UTI documented about a year ago prior to her back surgery.  ROS: no weight loss, fever, night sweats and feels well  O alert, oriented to person, place, and time   obese, healthy and  alert  Abdomen mild tenderness over the suprapubic area  No CVA tenderness  Pelvic: deferred   Diagnostic Test:    Urinalysis : cloudy, large RBC; + nitrates; moderate leuk's    urine culture and micro  Assessment: R/O UTI   Plan:  Maintain adequate hydration. Follow up if symptoms not improving, and as needed.  Medication Therapy:  Macrobid 100 mg BID for a week #14  Lab:TOC if Urine Culture is positive - and will plan to do   this at [facility] on 03/01/14  RX: Pyridium 200 mg TID prn  RX: Diflucan 150 mg if needed  Call back if symptoms worsens        RV

## 2014-02-03 NOTE — Patient Instructions (Signed)

## 2014-02-04 LAB — URINALYSIS, MICROSCOPIC ONLY
Casts: NONE SEEN
Crystals: NONE SEEN

## 2014-02-06 LAB — URINE CULTURE: Colony Count: >=100000

## 2014-02-07 NOTE — Progress Notes (Signed)
Encounter reviewed by Dr. Keishla Oyer Silva.  

## 2014-02-09 ENCOUNTER — Other Ambulatory Visit: Payer: Self-pay

## 2014-02-09 DIAGNOSIS — Z1231 Encounter for screening mammogram for malignant neoplasm of breast: Secondary | ICD-10-CM

## 2014-03-01 ENCOUNTER — Ambulatory Visit (INDEPENDENT_AMBULATORY_CARE_PROVIDER_SITE_OTHER): Payer: BLUE CROSS/BLUE SHIELD | Admitting: Nurse Practitioner

## 2014-03-01 ENCOUNTER — Encounter: Payer: Self-pay | Admitting: Nurse Practitioner

## 2014-03-01 VITALS — BP 110/78 | HR 84 | Resp 16 | Ht 65.0 in | Wt 202.0 lb

## 2014-03-01 DIAGNOSIS — R319 Hematuria, unspecified: Secondary | ICD-10-CM

## 2014-03-01 DIAGNOSIS — E559 Vitamin D deficiency, unspecified: Secondary | ICD-10-CM

## 2014-03-01 DIAGNOSIS — Z01419 Encounter for gynecological examination (general) (routine) without abnormal findings: Secondary | ICD-10-CM

## 2014-03-01 DIAGNOSIS — N390 Urinary tract infection, site not specified: Secondary | ICD-10-CM

## 2014-03-01 DIAGNOSIS — Z0000 Encounter for general adult medical examination without abnormal findings: Secondary | ICD-10-CM

## 2014-03-01 LAB — POCT URINALYSIS DIPSTICK
BILIRUBIN UA: NEGATIVE
GLUCOSE UA: NEGATIVE
KETONES UA: NEGATIVE
NITRITE UA: NEGATIVE
PH UA: 5
Protein, UA: NEGATIVE
Urobilinogen, UA: NEGATIVE

## 2014-03-01 MED ORDER — VITAMIN D (ERGOCALCIFEROL) 1.25 MG (50000 UNIT) PO CAPS: 50000.0000 [IU] | ORAL_CAPSULE | ORAL | Status: DC

## 2014-03-01 MED ORDER — NORETHINDRONE 0.35 MG PO TABS: 1.0000 | ORAL_TABLET | Freq: Every day | ORAL | Status: DC

## 2014-03-01 NOTE — Patient Instructions (Signed)

## 2014-03-01 NOTE — Progress Notes (Signed)
48 y.o. G2P2 Married Caucasian Fe here for annual exam.  Menses this past 6-8 months have bee more regular on Micronor.  Flow is 7 days and heavy for 2 days but manageable. Last HGB AIC was normal. She was seen for E Coli UTI 12/9 and has completed all med's - needs TOC today.  She currently has no symptoms.  Patient's last menstrual period was 02/08/2014 (approximate).          Sexually active: Yes.    The current method of family planning is OCP (estrogen/progesterone).    Exercising: Yes.    Walking Smoker:  no  Health Maintenance: Pap: 01/2012 neg. HR HPV:Neg MMG:  03/09/13 BIRADS1:neg Colonoscopy: Never BMD:   2009 TDaP:  2013 Labs: Hg= 12.2, Vitamin D   UA: RBC=Moderate, WBC=trace.   reports that she has never smoked. She has never used smokeless tobacco. She reports that she does not drink alcohol or use illicit drugs.  Past Medical History  Diagnosis Date  . Hypertension   . Fatty liver 12/11    bx revealed--granuloma  . Hyperlipidemia 07/2010  . Vitamin D deficiency disease 2009  . DDD (degenerative disc disease), lumbar 11/2008  . Diabetes mellitus 03/2010    Dr. Chalmers Cater  . Polymyalgia     Past Surgical History  Procedure Laterality Date  . Cesarean section  10/89 & 5/96  . Lumbar spine surgery  11/19/2012    L4-L5 fusion  . Wisdom tooth extraction  age 23    Current Outpatient Prescriptions  Medication Sig Dispense Refill  . calcium carbonate (OS-CAL) 600 MG TABS Take 600 mg by mouth 2 (two) times daily with a meal.    . ibuprofen (ADVIL,MOTRIN) 200 MG tablet Take 400 mg by mouth every 8 (eight) hours as needed.     Marland Kitchen lisinopril-hydrochlorothiazide (PRINZIDE,ZESTORETIC) 20-25 MG per tablet Take 1 tablet by mouth daily.    . meloxicam (MOBIC) 15 MG tablet Take 15 mg by mouth daily.    . metFORMIN (GLUCOPHAGE) 500 MG tablet Take 500 mg by mouth 2 (two) times daily with a meal.    . Multiple Vitamin (MULTIVITAMIN) tablet Take 1 tablet by mouth daily.    .  norethindrone (MICRONOR,CAMILA,ERRIN) 0.35 MG tablet Take 1 tablet (0.35 mg total) by mouth daily. 3 Package 3  . potassium chloride SA (K-DUR,KLOR-CON) 20 MEQ tablet   0  . traMADol (ULTRAM) 50 MG tablet   0  . Vitamin D, Ergocalciferol, (DRISDOL) 50000 UNITS CAPS capsule Take 1 capsule (50,000 Units total) by mouth every 14 (fourteen) days. 30 capsule 3  . [DISCONTINUED] norethindrone (AYGESTIN) 5 MG tablet Take 1 tablet (5 mg total) by mouth as directed. Take as directed 60 tablet 0   No current facility-administered medications for this visit.    Family History  Problem Relation Age of Onset  . Thyroid disease Mother   . Hypertension Mother   . Diabetes Maternal Grandmother   . Coronary artery disease Maternal Grandmother   . Coronary artery disease Father   . Hypertension Sister   . Coronary artery disease Maternal Grandfather   . Rheum arthritis Paternal Grandmother     ROS:  Pertinent items are noted in HPI.  Otherwise, a comprehensive ROS was negative.  Exam:   BP 110/78 mmHg  Pulse 84  Resp 16  Ht 5\' 5"  (1.651 m)  Wt 202 lb (91.627 kg)  BMI 33.61 kg/m2  LMP 02/08/2014 (Approximate) Height: 5\' 5"  (165.1 cm)  Ht Readings from Last 3  Encounters:  03/01/14 5\' 5"  (1.651 m)  02/03/14 5' 5.25" (1.657 m)  02/12/13 5' 5.25" (1.657 m)    General appearance: alert, cooperative and appears stated age Head: Normocephalic, without obvious abnormality, atraumatic Neck: no adenopathy, supple, symmetrical, trachea midline and thyroid normal to inspection and palpation Lungs: clear to auscultation bilaterally Breasts: normal appearance, no masses or tenderness Heart: regular rate and rhythm Abdomen: soft, non-tender; no masses,  no organomegaly Extremities: extremities normal, atraumatic, no cyanosis or edema Skin: Skin color, texture, turgor normal. No rashes or lesions Lymph nodes: Cervical, supraclavicular, and axillary nodes normal. No abnormal inguinal nodes  palpated Neurologic: Grossly normal   Pelvic: External genitalia:  no lesions              Urethra:  normal appearing urethra with no masses, tenderness or lesions              Bartholin's and Skene's: normal                 Vagina: normal appearing vagina with normal color and discharge, no lesions              Cervix: anteverted              Pap taken: Yes.   Bimanual Exam:  Uterus:  normal size, contour, position, consistency, mobility, non-tender              Adnexa: no mass, fullness, tenderness               Rectovaginal: Confirms               Anus:  normal sphincter tone, no lesions  A:  Well Woman with normal exam  POP for contraception and menses regulation Recent history of AUB 2015 - that has now resolved S/P Lumbar fusion L 4-5 with ORIF 10/2012 R/O UTI - also TOC from 02/03/2014 UTI History of Vit D deficiency, DM, HTN  P:   Reviewed health and wellness pertinent to exam  Pap smear taken today  Mammogram is due now and will schedule  Refill on POP for a year  Refill on Vit D and follow with labs  Counseled on breast self exam, mammography screening, adequate intake of calcium and vitamin D, diet and exercise return annually or prn  An After Visit Summary was printed and given to the patient.

## 2014-03-02 LAB — URINALYSIS, MICROSCOPIC ONLY
Bacteria, UA: NONE SEEN
Casts: NONE SEEN
Crystals: NONE SEEN
Squamous Epithelial / LPF: NONE SEEN

## 2014-03-02 LAB — VITAMIN D 25 HYDROXY (VIT D DEFICIENCY, FRACTURES): Vit D, 25-Hydroxy: 48 ng/mL (ref 30–100)

## 2014-03-02 LAB — URINE CULTURE
Colony Count: NO GROWTH
Organism ID, Bacteria: NO GROWTH

## 2014-03-02 LAB — HEMOGLOBIN, FINGERSTICK: Hemoglobin, fingerstick: 12.2 g/dL (ref 12.0–16.0)

## 2014-03-02 NOTE — Progress Notes (Signed)
Encounter reviewed by Dr. Brook Silva.  

## 2014-03-03 LAB — IPS PAP TEST WITH HPV

## 2014-03-12 ENCOUNTER — Ambulatory Visit
Admission: RE | Admit: 2014-03-12 | Discharge: 2014-03-12 | Disposition: A | Discharge status: Home / Self Care | Payer: BLUE CROSS/BLUE SHIELD | Source: Ambulatory Visit

## 2014-03-12 DIAGNOSIS — Z1231 Encounter for screening mammogram for malignant neoplasm of breast: Secondary | ICD-10-CM

## 2015-02-14 ENCOUNTER — Other Ambulatory Visit: Payer: Self-pay

## 2015-02-14 DIAGNOSIS — Z1231 Encounter for screening mammogram for malignant neoplasm of breast: Secondary | ICD-10-CM

## 2015-03-04 ENCOUNTER — Ambulatory Visit (INDEPENDENT_AMBULATORY_CARE_PROVIDER_SITE_OTHER): Payer: BLUE CROSS/BLUE SHIELD | Admitting: Nurse Practitioner

## 2015-03-04 ENCOUNTER — Encounter: Payer: Self-pay | Admitting: Nurse Practitioner

## 2015-03-04 VITALS — BP 118/84 | HR 72 | Ht 65.0 in | Wt 200.0 lb

## 2015-03-04 DIAGNOSIS — Z01419 Encounter for gynecological examination (general) (routine) without abnormal findings: Secondary | ICD-10-CM | POA: Diagnosis not present

## 2015-03-04 DIAGNOSIS — Z0000 Encounter for general adult medical examination without abnormal findings: Secondary | ICD-10-CM | POA: Diagnosis not present

## 2015-03-04 DIAGNOSIS — E559 Vitamin D deficiency, unspecified: Secondary | ICD-10-CM | POA: Diagnosis not present

## 2015-03-04 DIAGNOSIS — N390 Urinary tract infection, site not specified: Secondary | ICD-10-CM

## 2015-03-04 DIAGNOSIS — R82998 Other abnormal findings in urine: Secondary | ICD-10-CM

## 2015-03-04 LAB — POCT URINALYSIS DIPSTICK
Bilirubin, UA: NEGATIVE
Blood, UA: NEGATIVE
GLUCOSE UA: NEGATIVE
Ketones, UA: NEGATIVE
Nitrite, UA: NEGATIVE
Protein, UA: NEGATIVE
UROBILINOGEN UA: NEGATIVE
pH, UA: 5

## 2015-03-04 MED ORDER — VITAMIN D (ERGOCALCIFEROL) 1.25 MG (50000 UNIT) PO CAPS: 50000.0000 [IU] | ORAL_CAPSULE | ORAL | Status: DC

## 2015-03-04 MED ORDER — NORETHINDRONE 0.35 MG PO TABS: 1.0000 | ORAL_TABLET | Freq: Every day | ORAL | Status: DC

## 2015-03-04 NOTE — Patient Instructions (Signed)

## 2015-03-04 NOTE — Progress Notes (Addendum)
Patient ID: Meghan Lyons, female   DOB: 1966-02-17, 50 y.o.   MRN: [id number] 50 y.o. G38P0002 Married  Caucasian Fe here for annual exam.  Last HGB A1C was normal 3 months ago and is followed closely by Dr. Chalmers Cater.  Her Glucophage was reduced from BID to daily.   Some irregular menses this past year.  Interval between menses is varied from the closest at being 14 days (which is rare) from last cycle to 45 days apart.  Still lasting 7 days.  Moderate for 2-3 days and then light to spotting.  She remains on POP.   She has just completed strong antibiotics for facial abscess, but did take a Diflucan X 2 for prevention of yeast. Denies urinary symptoms.   Patient's last menstrual period was 02/16/2015 (exact date).          Sexually active: Yes.    The current method of family planning is oral progesterone-only contraceptive.    Exercising: Yes.    walking, ruptured tendon in ankle Smoker:  no  Health Maintenance: Pap: 03/01/14, Negative with neg HR HPV MMG: 03/12/14, 3D, Bi-Rads 1: Negative; scheduled for 03/17/15 TDaP:  2013 Shingles: Not indicated due to age Pneumonia: Not indicated due to age Hep C and HIV: Hep C not indicated due to age; HIV completed ?2103 with PCP, will find out date Labs: HB: 12.7  Urine: 1+ leuk's   reports that she has never smoked. She has never used smokeless tobacco. She reports that she does not drink alcohol or use illicit drugs.  Past Medical History  Diagnosis Date  . Hypertension   . Fatty liver 12/11    bx revealed--granuloma  . Hyperlipidemia 07/2010  . Vitamin D deficiency disease 2009  . DDD (degenerative disc disease), lumbar 11/2008  . Diabetes mellitus 03/2010    Dr. Chalmers Cater  . Polymyalgia [facility])     Past Surgical History  Procedure Laterality Date  . Cesarean section  10/89 & 5/96  . Lumbar spine surgery  11/19/2012    L4-L5 fusion  . Wisdom tooth extraction  age 55    Current Outpatient Prescriptions  Medication Sig Dispense Refill  . calcium  carbonate (OS-CAL) 600 MG TABS Take 600 mg by mouth 2 (two) times daily with a meal.    . lisinopril-hydrochlorothiazide (PRINZIDE,ZESTORETIC) 20-25 MG per tablet Take 1 tablet by mouth daily.    . meloxicam (MOBIC) 15 MG tablet Take 15 mg by mouth daily.    . metFORMIN (GLUCOPHAGE) 500 MG tablet Take 500 mg by mouth daily before lunch.     . Multiple Vitamin (MULTIVITAMIN) tablet Take 1 tablet by mouth daily.    . norethindrone (MICRONOR,CAMILA,ERRIN) 0.35 MG tablet Take 1 tablet (0.35 mg total) by mouth daily. 3 Package 4  . potassium chloride SA (K-DUR,KLOR-CON) 20 MEQ tablet   0  . traMADol (ULTRAM) 50 MG tablet   0  . Vitamin D, Ergocalciferol, (DRISDOL) 50000 units CAPS capsule Take 1 capsule (50,000 Units total) by mouth every 14 (fourteen) days. 30 capsule 3  . [DISCONTINUED] norethindrone (AYGESTIN) 5 MG tablet Take 1 tablet (5 mg total) by mouth as directed. Take as directed 60 tablet 0   No current facility-administered medications for this visit.    Family History  Problem Relation Age of Onset  . Thyroid disease Mother   . Hypertension Mother   . Diabetes Maternal Grandmother   . Coronary artery disease Maternal Grandmother   . Coronary artery disease Father   .  Hypertension Sister   . Coronary artery disease Maternal Grandfather   . Rheum arthritis Paternal Grandmother     ROS:  Pertinent items are noted in HPI.  Otherwise, a comprehensive ROS was negative.  Exam:   BP 118/84 mmHg  Pulse 72  Ht 5\' 5"  (1.651 m)  Wt 200 lb (90.719 kg)  BMI 33.28 kg/m2  LMP 02/16/2015 (Exact Date) Height: 5\' 5"  (165.1 cm) Ht Readings from Last 3 Encounters:  03/04/15 5\' 5"  (1.651 m)  03/01/14 5\' 5"  (1.651 m)  02/03/14 5' 5.25" (1.657 m)    General appearance: alert, cooperative and appears stated age Head: Normocephalic, without obvious abnormality, atraumatic Neck: no adenopathy, supple, symmetrical, trachea midline and thyroid normal to inspection and palpation Lungs: clear to  auscultation bilaterally Breasts: normal appearance, no masses or tenderness Heart: regular rate and rhythm Abdomen: soft, non-tender; no masses,  no organomegaly Extremities: extremities normal, atraumatic, no cyanosis or edema Skin: Skin color, texture, turgor normal. No rashes or lesions Lymph nodes: Cervical, supraclavicular, and axillary nodes normal. No abnormal inguinal nodes palpated Neurologic: Grossly normal   Pelvic: External genitalia:  no lesions              Urethra:  normal appearing urethra with no masses, tenderness or lesions              Bartholin's and Skene's: normal                 Vagina: normal appearing vagina with normal color and discharge, no lesions              Cervix: anteverted              Pap taken: No. Bimanual Exam:  Uterus:  normal size, contour, position, consistency, mobility, non-tender              Adnexa: no mass, fullness, tenderness               Rectovaginal: Confirms               Anus:  normal sphincter tone, no lesions  Chaperone present: no  A:  Well Woman with normal exam  POP for contraception and menses regulation  History of HTN and DM S/P Lumbar fusion L 4-5 with ORIF 10/2012 R/O UTI - no symptoms History of Vit D deficiency   P:   Reviewed health and wellness pertinent to exam  Pap smear as above  Mammogram is due 03/17/15 as scheduled  Will refill Vit D every 2 weeks and follow with labs  Will refill POP and continue to monitor menses - if heavy or prolonged bleeding to let us know  Counseled on breast self exam, mammography screening, use and side effects of OCP's, adequate intake of calcium and vitamin D, diet and exercise return annually or prn  An After Visit Summary was printed and given to the patient.

## 2015-03-05 LAB — VITAMIN D 25 HYDROXY (VIT D DEFICIENCY, FRACTURES): Vit D, 25-Hydroxy: 53 ng/mL (ref 30–100)

## 2015-03-05 NOTE — Progress Notes (Signed)
Encounter reviewed by Dr. Aundria Rud. May need re-evaluation of menstrual cycles if cycles are 14 days from one cycle to the next.

## 2015-03-06 LAB — URINE CULTURE
Colony Count: NO GROWTH
ORGANISM ID, BACTERIA: NO GROWTH

## 2015-03-07 LAB — HEMOGLOBIN, FINGERSTICK: HEMOGLOBIN, FINGERSTICK: 12.7 g/dL (ref 12.0–16.0)

## 2015-03-17 ENCOUNTER — Ambulatory Visit
Admission: RE | Admit: 2015-03-17 | Discharge: 2015-03-17 | Disposition: A | Discharge status: Home / Self Care | Payer: BLUE CROSS/BLUE SHIELD | Source: Ambulatory Visit

## 2015-03-17 DIAGNOSIS — Z1231 Encounter for screening mammogram for malignant neoplasm of breast: Secondary | ICD-10-CM

## 2016-03-02 ENCOUNTER — Other Ambulatory Visit: Payer: Self-pay | Admitting: Nurse Practitioner

## 2016-03-02 DIAGNOSIS — Z1231 Encounter for screening mammogram for malignant neoplasm of breast: Secondary | ICD-10-CM

## 2016-03-09 ENCOUNTER — Ambulatory Visit: Payer: BLUE CROSS/BLUE SHIELD | Admitting: Nurse Practitioner

## 2016-03-27 ENCOUNTER — Ambulatory Visit: Payer: BLUE CROSS/BLUE SHIELD | Admitting: Nurse Practitioner

## 2016-03-27 ENCOUNTER — Encounter: Payer: Self-pay | Admitting: Nurse Practitioner

## 2016-03-27 VITALS — BP 124/82 | HR 71 | Ht 65.0 in | Wt 209.0 lb

## 2016-03-27 DIAGNOSIS — Z01411 Encounter for gynecological examination (general) (routine) with abnormal findings: Secondary | ICD-10-CM | POA: Diagnosis not present

## 2016-03-27 DIAGNOSIS — R350 Frequency of micturition: Secondary | ICD-10-CM | POA: Diagnosis not present

## 2016-03-27 DIAGNOSIS — Z0000 Encounter for general adult medical examination without abnormal findings: Secondary | ICD-10-CM | POA: Diagnosis not present

## 2016-03-27 DIAGNOSIS — N760 Acute vaginitis: Secondary | ICD-10-CM | POA: Diagnosis not present

## 2016-03-27 LAB — POCT URINALYSIS DIPSTICK
Bilirubin, UA: NEGATIVE
Glucose, UA: 50
Ketones, UA: NEGATIVE
Nitrite, UA: NEGATIVE
Protein, UA: NEGATIVE
UROBILINOGEN UA: NEGATIVE
pH, UA: 5

## 2016-03-27 MED ORDER — NITROFURANTOIN MONOHYD MACRO 100 MG PO CAPS: 100.0000 mg | ORAL_CAPSULE | Freq: Two times a day (BID) | ORAL | 0 refills | Status: DC

## 2016-03-27 MED ORDER — VITAMIN D (ERGOCALCIFEROL) 1.25 MG (50000 UNIT) PO CAPS: 50000.0000 [IU] | ORAL_CAPSULE | ORAL | 3 refills | Status: DC

## 2016-03-27 MED ORDER — NORETHINDRONE 0.35 MG PO TABS: 1.0000 | ORAL_TABLET | Freq: Every day | ORAL | 4 refills | Status: DC

## 2016-03-27 NOTE — Patient Instructions (Signed)

## 2016-03-27 NOTE — Progress Notes (Signed)
Patient ID: Meghan Lyons, female   DOB: 1965/07/24, 51 y.o.   MRN: [id number]  51 y.o. G75P2002 Married  Caucasian Fe here for annual exam.  No new health problems other than mixed incontinence.  She is wearing a pad daily  Now wants to pursue treatment options. Last HGB AIC 03/22/16 was 5.8.  Vit D was at 49.  Patient's last menstrual period was 03/07/2016 (exact date).          Sexually active: Yes.    The current method of family planning is oral progesterone-only contraceptive.    Exercising: Yes.    walking 3 times per week, unable to exercise as much due to back surgery Smoker:  no  Health Maintenance: Pap:  03/01/14, Negative with neg HR HPV MMG: 03/17/15, Bi-Rads 1: Negative; scheduled for 03/30/16 TDaP:  2013 HIV: 1996 with pregnancy Labs: PCP takes care of all labs  Urine: 2+ leuk's, small RBC, 50 mg glucose - just had lunch   reports that she has never smoked. She has never used smokeless tobacco. She reports that she does not drink alcohol or use drugs.  Past Medical History:  Diagnosis Date  . DDD (degenerative disc disease), lumbar 11/2008  . Diabetes mellitus 03/2010   Dr. Chalmers Cater  . Fatty liver 12/11   bx revealed--granuloma  . Hyperlipidemia 07/2010  . Hypertension   . Polymyalgia ([place])   . Vitamin D deficiency disease 2009    Past Surgical History:  Procedure Laterality Date  . CESAREAN SECTION  10/89 & 5/96  . LUMBAR SPINE SURGERY  11/19/2012   L4-L5 fusion  . WISDOM TOOTH EXTRACTION  age 17    Current Outpatient Prescriptions  Medication Sig Dispense Refill  . calcium carbonate (OS-CAL) 600 MG TABS Take 600 mg by mouth 2 (two) times daily with a meal.    . lisinopril-hydrochlorothiazide (PRINZIDE,ZESTORETIC) 20-25 MG per tablet Take 1 tablet by mouth daily.    . meloxicam (MOBIC) 15 MG tablet Take 15 mg by mouth daily.    . metFORMIN (GLUCOPHAGE) 500 MG tablet Take 500 mg by mouth daily before lunch.     . Multiple Vitamin (MULTIVITAMIN) tablet Take 1 tablet by  mouth daily.    . norethindrone (MICRONOR,CAMILA,ERRIN) 0.35 MG tablet Take 1 tablet (0.35 mg total) by mouth daily. 3 Package 4  . potassium chloride SA (K-DUR,KLOR-CON) 20 MEQ tablet Take 20 mEq by mouth 2 (two) times daily.   0  . traMADol (ULTRAM) 50 MG tablet   0  . Vitamin D, Ergocalciferol, (DRISDOL) 50000 units CAPS capsule Take 1 capsule (50,000 Units total) by mouth every 14 (fourteen) days. (Patient taking differently: Take 50,000 Units by mouth every 7 (seven) days. ) 30 capsule 3   No current facility-administered medications for this visit.     Family History  Problem Relation Age of Onset  . Thyroid disease Mother   . Hypertension Mother   . Diabetes Maternal Grandmother   . Coronary artery disease Maternal Grandmother   . Coronary artery disease Father   . Hypertension Sister   . Coronary artery disease Maternal Grandfather   . Rheum arthritis Paternal Grandmother     ROS:  Pertinent items are noted in HPI.  Otherwise, a comprehensive ROS was negative.  Exam:   BP 124/82 (BP Location: Right Arm, Patient Position: Sitting, Cuff Size: Large)   Pulse 71   Ht 5\' 5"  (1.651 m)   Wt 209 lb (94.8 kg)   LMP 03/07/2016 (Exact Date)  BMI 34.78 kg/m  Height: 5\' 5"  (165.1 cm) Ht Readings from Last 3 Encounters:  03/27/16 5\' 5"  (1.651 m)  03/04/15 5\' 5"  (1.651 m)  03/01/14 5\' 5"  (1.651 m)    General appearance: alert, cooperative and appears stated age Head: Normocephalic, without obvious abnormality, atraumatic Neck: no adenopathy, supple, symmetrical, trachea midline and thyroid normal to inspection and palpation Lungs: clear to auscultation bilaterally Breasts: normal appearance, no masses or tenderness Heart: regular rate and rhythm Abdomen: soft, non-tender; no masses,  no organomegaly Extremities: extremities normal, atraumatic, no cyanosis or edema Skin: Skin color, texture, turgor normal. No rashes or lesions Lymph nodes: Cervical, supraclavicular, and  axillary nodes normal. No abnormal inguinal nodes palpated Neurologic: Grossly normal   Pelvic: External genitalia:  no lesions              Urethra:  normal appearing urethra with no masses, tenderness or lesions              Bartholin's and Skene's: normal                 Vagina: normal appearing vagina with normal color and discharge, no lesions              Cervix: anteverted              Pap taken: No. Bimanual Exam:  Uterus:  normal size, contour, position, consistency, mobility, non-tender              Adnexa: no mass, fullness, tenderness               Rectovaginal: Confirms               Anus:  normal sphincter tone, no lesions  Chaperone present: yes  A:  Well Woman with normal exam  POP for contraception and menses regulation             History of HTN and DM S/P Lumbar fusion L 4-5 with ORIF 10/2012 R/O UTI - with urge incontinence History of Vit D deficiency  R/O vaginitis   P:   Reviewed health and wellness pertinent to exam  Pap smear not done  Mammogram is due 03/30/2016  Refill on Vit D to use weekly during the winter and every other week from spring to fall.  Will go ahead and start her on Macrobid BID # 14 until urine C&S results  Refill on Micronor for a year  She will return and see Dr. Quincy Simmonds  Follow with [facility]  Counseled on breast self exam, mammography screening, adequate intake of calcium and vitamin D, diet and exercise, Kegel's exercises return annually or prn  An After Visit Summary was printed and given to the patient.

## 2016-03-28 ENCOUNTER — Other Ambulatory Visit: Payer: Self-pay | Admitting: Nurse Practitioner

## 2016-03-28 LAB — URINALYSIS, MICROSCOPIC ONLY
Bacteria, UA: NONE SEEN [HPF]
Casts: NONE SEEN [LPF]
Crystals: NONE SEEN [HPF]
RBC / HPF: NONE SEEN RBC/HPF (ref <=2)
Yeast: NONE SEEN [HPF]

## 2016-03-28 LAB — WET PREP BY MOLECULAR PROBE
Candida species: POSITIVE — AB
GARDNERELLA VAGINALIS: POSITIVE — AB
TRICHOMONAS VAG: NEGATIVE

## 2016-03-28 LAB — URINE CULTURE: Organism ID, Bacteria: NO GROWTH

## 2016-03-28 MED ORDER — METRONIDAZOLE 0.75 % VA GEL: 1.0000 | Freq: Every day | VAGINAL | 0 refills | Status: DC

## 2016-03-28 MED ORDER — FLUCONAZOLE 150 MG PO TABS: 150.0000 mg | ORAL_TABLET | Freq: Once | ORAL | 0 refills | Status: AC

## 2016-03-28 NOTE — Progress Notes (Signed)
Encounter reviewed by Dr. Brook Amundson C. Silva.  

## 2016-03-30 ENCOUNTER — Ambulatory Visit
Admission: RE | Admit: 2016-03-30 | Discharge: 2016-03-30 | Disposition: A | Discharge status: Home / Self Care | Payer: BLUE CROSS/BLUE SHIELD | Source: Ambulatory Visit | Attending: Nurse Practitioner | Admitting: Nurse Practitioner

## 2016-03-30 DIAGNOSIS — Z1231 Encounter for screening mammogram for malignant neoplasm of breast: Secondary | ICD-10-CM

## 2016-04-04 ENCOUNTER — Encounter: Payer: Self-pay | Admitting: Nurse Practitioner

## 2016-04-06 ENCOUNTER — Ambulatory Visit (INDEPENDENT_AMBULATORY_CARE_PROVIDER_SITE_OTHER): Payer: BLUE CROSS/BLUE SHIELD | Admitting: Obstetrics and Gynecology

## 2016-04-06 ENCOUNTER — Encounter: Payer: Self-pay | Admitting: Obstetrics and Gynecology

## 2016-04-06 VITALS — BP 100/74 | HR 80 | Ht 65.0 in | Wt 211.6 lb

## 2016-04-06 DIAGNOSIS — N762 Acute vulvitis: Secondary | ICD-10-CM

## 2016-04-06 DIAGNOSIS — Z0000 Encounter for general adult medical examination without abnormal findings: Secondary | ICD-10-CM

## 2016-04-06 DIAGNOSIS — R32 Unspecified urinary incontinence: Secondary | ICD-10-CM | POA: Diagnosis not present

## 2016-04-06 LAB — POCT URINALYSIS DIPSTICK
Bilirubin, UA: NEGATIVE
GLUCOSE UA: NEGATIVE
KETONES UA: NEGATIVE
Leukocytes, UA: NEGATIVE
Nitrite, UA: NEGATIVE
Protein, UA: NEGATIVE
Urobilinogen, UA: NEGATIVE
pH, UA: 5

## 2016-04-06 MED ORDER — SULFAMETHOXAZOLE-TRIMETHOPRIM 800-160 MG PO TABS: 1.0000 | ORAL_TABLET | Freq: Two times a day (BID) | ORAL | 0 refills | Status: DC

## 2016-04-06 NOTE — Progress Notes (Signed)
GYNECOLOGY  VISIT   HPI: 51 y.o.   Married  Caucasian  female   G2P2002 with Patient's last menstrual period was 04/04/2016 (exact date).   here for urinary incontinence--no stress incontinence.   Leaks all day long.  Leaking since birth of her second child. Is more dry at night when she is lying down.   Sometimes feels it and sometimes not.  Wears a thin pad all the time. No urgency issues.   Thinks she is leaking with cough, laugh, or sneeze.  No leak with running water or key in lock.   DF - every 4 hours. NF - none.  No enuresis.   Feels like she does not empty well.  Double, triple voids.  UTI often.  States she does not have symptoms but has them detected at office visits where urine is checked. Last UTI here was E coli in 2015.  No hematuria. No renal stones.  Sometimes has constipation.  No med use.  No fecal incontinence.  Has diabetes and HTN.  Hgb A1c 5.8 on 03/22/16.  Hx lumbar fusion.   Hx C/S x two.   Sodas - sips Coke all day long.  1.5 per day.  Tea rarely.  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 No ETOH use.   Just took Diflucan and Metrogel for yeast and BV.  Urine Dip: Mod.RBCs--see LMP  GYNECOLOGIC HISTORY: Patient's last menstrual period was 04/04/2016 (exact date). Contraception: OCPs--Micronor Menopausal hormone therapy:  n/a Last mammogram: 03-30-16 Density C/Neg/BiRads1:TBC Last pap smear:   03-01-14 Neg:Neg HR HPV, 02-11-12 Neg:Neg HR HPV        OB History    Gravida Para Term Preterm AB Living   2 2 2  0 0 2   SAB TAB Ectopic Multiple Live Births   0 0 0 0 2         Patient Active Problem List   Diagnosis Date Noted  . Menorrhagia 08/16/2012    Past Medical History:  Diagnosis Date  . DDD (degenerative disc disease), lumbar 11/2008  . Diabetes mellitus 03/2010   Dr. Chalmers Cater  . Fatty liver 12/11   bx revealed--granuloma  . Hyperlipidemia 07/2010  . Hypertension   . Polymyalgia ([place])   . Vitamin D deficiency disease 2009     Past Surgical History:  Procedure Laterality Date  . CESAREAN SECTION  10/89 & 5/96  . LUMBAR SPINE SURGERY  11/19/2012   L4-L5 fusion  . WISDOM TOOTH EXTRACTION  age 57    Current Outpatient Prescriptions  Medication Sig Dispense Refill  . calcium carbonate (OS-CAL) 600 MG TABS Take 600 mg by mouth 2 (two) times daily with a meal.    . lisinopril-hydrochlorothiazide (PRINZIDE,ZESTORETIC) 20-25 MG per tablet Take 1 tablet by mouth daily.    . meloxicam (MOBIC) 15 MG tablet Take 15 mg by mouth daily.    . metFORMIN (GLUCOPHAGE) 500 MG tablet Take 500 mg by mouth daily before lunch.     . metroNIDAZOLE (METROGEL) 0.75 % vaginal gel Place 1 Applicatorful vaginally at bedtime. 70 g 0  . Multiple Vitamin (MULTIVITAMIN) tablet Take 1 tablet by mouth daily.    . norethindrone (MICRONOR,CAMILA,ERRIN) 0.35 MG tablet Take 1 tablet (0.35 mg total) by mouth daily. 3 Package 4  . potassium chloride SA (K-DUR,KLOR-CON) 20 MEQ tablet Take 20 mEq by mouth 2 (two) times daily.   0  . traMADol (ULTRAM) 50 MG tablet   0  . Vitamin D, Ergocalciferol, (DRISDOL) 50000 units CAPS capsule Take 1  capsule (50,000 Units total) by mouth every 7 (seven) days. 30 capsule 3   No current facility-administered medications for this visit.      ALLERGIES: Cephalexin  Family History  Problem Relation Age of Onset  . Thyroid disease Mother   . Hypertension Mother   . Diabetes Maternal Grandmother   . Coronary artery disease Maternal Grandmother   . Coronary artery disease Father   . Hypertension Sister   . Coronary artery disease Maternal Grandfather   . Rheum arthritis Paternal Grandmother     Social History   Social History  . Marital status: Married    Spouse name: N/A  . Number of children: N/A  . Years of education: N/A   Occupational History  . Not on file.   Social History Main Topics  . Smoking status: Never Smoker  . Smokeless tobacco: Never Used  . Alcohol use No     Comment: rare  .  Drug use: No  . Sexual activity: Yes    Partners: Male    Birth control/ protection: Pill     Comment: micronor   Other Topics Concern  . Not on file   Social History Narrative  . No narrative on file    ROS:  Pertinent items are noted in HPI.  PHYSICAL EXAMINATION:    BP 100/74 (BP Location: Right Arm, Patient Position: Sitting, Cuff Size: Large)   Pulse 80   Ht 5\' 5"  (1.651 m)   Wt 211 lb 9.6 oz (96 kg)   LMP 04/04/2016 (Exact Date)   BMI 35.21 kg/m     General appearance: alert, cooperative and appears stated age   Pelvic: External genitalia:   Mons pubis with 2 cm area of erythema with central area of induration.               Urethra:  normal appearing urethra with no masses, tenderness or lesions              Bartholins and Skenes: normal                 Vagina: normal appearing vagina with normal color and discharge, no lesions.  First degree cystocele.  Menstrual flow.              Cervix: no lesions                Bimanual Exam:  Uterus:  normal size, contour, position, consistency, mobility, non-tender              Adnexa: no mass, fullness, tenderness              Rectal exam: Yes.  .  Confirms.              Anus:  normal sphincter tone, no lesions  Chaperone was present for exam.  ASSESSMENT  Urinary incontinence.  Probable mixed incontinence.  Urinary fistula? Vulvar cellulitis.  Possible early abscess.   PLAN  Discussed forms of urinary incontinence.  ACOG handout. She will do tampon test at home with 2 AZO standard.  If positive test, will repeat in the office and then determine if needs voiding cystourethrogram.  Return for urodynamic testing.  Discussed physical therapy, medical therapy, and surgery at methods to treat incontinence. Bactrim DS po bid for one week.  Return if area on the vulva does not resolve.    An After Visit Summary was printed and given to the patient.  __25____ minutes face to face time of which  over 50% was spent in  counseling.

## 2016-04-06 NOTE — Patient Instructions (Signed)
Place a tampon in the vagina and then take 2 AZO standard pills.  Remove the tampon 4 hours later and examine it for staining.  If it is stained, we may need to proceed forward with repeating this in the office setting and then a radiologic test to rule out a fistula, which is a connection between the urinary system and the vagina.

## 2016-04-09 ENCOUNTER — Telehealth: Payer: Self-pay | Admitting: Obstetrics and Gynecology

## 2016-04-09 NOTE — Telephone Encounter (Signed)
Spoke with patient regarding urodynamic testing. Reviewed benefit. Patient agreeable.  Reviewed two dates for scheduling. Patient not agreeable to 04-17-16. States she has been overwhelmed with doctor appointments this month. Offered 05-09-16 as an approximate date per Lamont Snowball. Patient states she cannot make appointments on Wednesday's due to work schedule. Patient mentioned pushing appointment until April if can come any day other than a Wednesday. Patient agreeable to return call to discuss scheduling and pre-procedure instructions.   While on call patient states she had questions regarding an instruction Dr. Quincy Simmonds gave her at her last appointment. She was to take AZO over the weekend but saw multiple types and unsure which to use.   Patient agreeable to return call to review.  Routing to [place] per [place].

## 2016-04-09 NOTE — Telephone Encounter (Signed)
Spoke with patient and advised to take AZO Standard and explained the main reason for using this is to change the color of her urine for the test she has to do at home. Patient verbalized understanding and agreeable.  Also spoke with patient about the dates for urodynmaics. Advised we only do urodynamics on Wednesdays and that there were extenuating circumstances for having one on a Tuesday. Patient was also informed that time will be limited in March and April, so it is recommended to schedule her urodynamics on 04/17/16. Patient agreed to scheduling her urodynamics on 04/17/16 at 0830.Patient's U/A lab appointment is scheduled for 04/13/16.

## 2016-04-13 ENCOUNTER — Ambulatory Visit (INDEPENDENT_AMBULATORY_CARE_PROVIDER_SITE_OTHER): Payer: BLUE CROSS/BLUE SHIELD | Admitting: *Deleted

## 2016-04-13 VITALS — BP 122/70 | HR 96 | Resp 18 | Ht 65.0 in | Wt 211.0 lb

## 2016-04-13 DIAGNOSIS — R32 Unspecified urinary incontinence: Secondary | ICD-10-CM

## 2016-04-13 LAB — POCT URINALYSIS DIPSTICK
Bilirubin, UA: NEGATIVE
Glucose, UA: NEGATIVE
Ketones, UA: NEGATIVE
LEUKOCYTES UA: NEGATIVE
Nitrite, UA: NEGATIVE
PROTEIN UA: NEGATIVE
UROBILINOGEN UA: NEGATIVE
pH, UA: 5

## 2016-04-13 NOTE — Progress Notes (Signed)
Patient in for UA before urodynamics.  Patient denies any dysuria, irritation, frequency, urgency.   UA: RBC= Moderate. Patient is at the end of her period. Urine culture sent.    Routed to Dr. Quincy Simmonds for review.  Encounter closed.

## 2016-04-15 LAB — URINE CULTURE: Organism ID, Bacteria: NO GROWTH

## 2016-04-15 NOTE — Progress Notes (Signed)
Encounter reviewed by Dr. Holger Sokolowski Amundson C. Silva.  

## 2016-04-17 ENCOUNTER — Ambulatory Visit (INDEPENDENT_AMBULATORY_CARE_PROVIDER_SITE_OTHER): Payer: BLUE CROSS/BLUE SHIELD | Admitting: *Deleted

## 2016-04-17 DIAGNOSIS — R32 Unspecified urinary incontinence: Secondary | ICD-10-CM

## 2016-04-17 NOTE — Progress Notes (Signed)
Meghan Lyons is a 51 y.o. female Who presents today for urodynamics testing, ordered by Dr. Quincy Simmonds.   Allergies and medications reviewed.  Denies complaints today. No urinary complaints.   Urine Culture exam: negative for WBC's or RBC's, okay to proceed per Dr. Quincy Simmonds.  Patient reports urinary leakage with coughing, sneezing, exercise.   Urodynamics testing initiated. Lumax Bladder Catheter #10 [place] and lumax Abdominal Catheter #10 [place].   Post void residual 10 ml.   Urethral catheter placed without issue. Rectal catheter placed without issue.   Urodynamics testing completed. Please see scanned Patient summary report in Epic. Procedure completed and patient tolerated well without complaints. Patient scheduled for follow up office visit with Dr. Quincy Simmonds to discuss results. Patient agreeable.   Patient given post procedure instructions:  You may have a mild bladder and rectal discomfort for a few hours after the test. You may experience some frequent urination and slight burning the first few times you urinate after the test. Rarely, the urine may be blood tinged. These are both due to catheter placements and resolve quickly. You should call our office immediately if you have signs of infection, which may include bladder pain, urinary urgency, fever, or burning during urination. We do encourage you to drink plenty of water after the test.

## 2016-04-22 NOTE — Progress Notes (Addendum)
Encounter reviewed by Dr. Aundria Rud.  Multichannel urodynamic testing performed.  Uroflow - Void 60 cc, not continuous, PVR 10 cc.  CMG - S1 223 cc, S2 327 CC, S3 389 cc.  VLPP none.  Stable CMG. UPP - 39 cm H20.  CMG - Pdet Max 17 cm H20.  Voided 448 cc.   No evidence of stress incontinence or urinary incontinence.

## 2016-04-25 NOTE — Progress Notes (Signed)
GYNECOLOGY  VISIT   HPI: 51 y.o.   Married  Caucasian  female   G2P2002 with Patient's last menstrual period was 04/04/2016 (exact date).   here for consult to discuss urodynamics testing.    Multichannel urodynamic testing performed.  Uroflow - Void 60 cc, not continuous, PVR 10 cc.  CMG - S1 223 cc, S2 327 CC, S3 389 cc.  VLPP none.  Stable CMG. UPP - 39 cm H20.  CMG - Pdet Max 17 cm H20.  Voided 448 cc.   No evidence of stress incontinence or urinary incontinence.   She though she leaked during the procedure with Valsalva, but this was not confirmed.   She feels she is wet all the time. Never has true urge.  No leak with laugh, cough, sneeze.  May leak the more busy she is, just is hard to catch it.  Started after the birth of her second child by Cesarean Section.   16 ounce caffeine per day.  GYNECOLOGIC HISTORY: Patient's last menstrual period was 04/04/2016 (exact date). Contraception:  OCPs--Micronor Menopausal hormone therapy:  n/a Last mammogram: 03-30-16 Density C/Neg/BiRads1:TBC  Last pap smear:   03-01-14 Neg:Neg HR HPV, 02-11-12 Neg:Neg HR HPV        OB History    Gravida Para Term Preterm AB Living   2 2 2  0 0 2   SAB TAB Ectopic Multiple Live Births   0 0 0 0 2         Patient Active Problem List   Diagnosis Date Noted  . Menorrhagia 08/16/2012    Past Medical History:  Diagnosis Date  . DDD (degenerative disc disease), lumbar 11/2008  . Diabetes mellitus 03/2010   Dr. Chalmers Cater  . Fatty liver 12/11   bx revealed--granuloma  . Hyperlipidemia 07/2010  . Hypertension   . Polymyalgia ([place])   . Vitamin D deficiency disease 2009    Past Surgical History:  Procedure Laterality Date  . CESAREAN SECTION  10/89 & 5/96  . LUMBAR SPINE SURGERY  11/19/2012   L4-L5 fusion  . WISDOM TOOTH EXTRACTION  age 30    Current Outpatient Prescriptions  Medication Sig Dispense Refill  . calcium carbonate (OS-CAL) 600 MG TABS Take 600 mg by mouth 2 (two) times daily  with a meal.    . lisinopril-hydrochlorothiazide (PRINZIDE,ZESTORETIC) 20-25 MG per tablet Take 1 tablet by mouth daily.    . meloxicam (MOBIC) 15 MG tablet Take 15 mg by mouth daily.    . metFORMIN (GLUCOPHAGE) 500 MG tablet Take 500 mg by mouth daily before lunch.     . Multiple Vitamin (MULTIVITAMIN) tablet Take 1 tablet by mouth daily.    . norethindrone (MICRONOR,CAMILA,ERRIN) 0.35 MG tablet Take 1 tablet (0.35 mg total) by mouth daily. 3 Package 4  . potassium chloride SA (K-DUR,KLOR-CON) 20 MEQ tablet Take 20 mEq by mouth 2 (two) times daily.   0  . traMADol (ULTRAM) 50 MG tablet   0  . Vitamin D, Ergocalciferol, (DRISDOL) 50000 units CAPS capsule Take 1 capsule (50,000 Units total) by mouth every 7 (seven) days. 30 capsule 3   No current facility-administered medications for this visit.      ALLERGIES: Cephalexin  Family History  Problem Relation Age of Onset  . Thyroid disease Mother   . Hypertension Mother   . Diabetes Maternal Grandmother   . Coronary artery disease Maternal Grandmother   . Coronary artery disease Father   . Hypertension Sister   . Coronary artery disease  Maternal Grandfather   . Rheum arthritis Paternal Grandmother     Social History   Social History  . Marital status: Married    Spouse name: N/A  . Number of children: N/A  . Years of education: N/A   Occupational History  . Not on file.   Social History Main Topics  . Smoking status: Never Smoker  . Smokeless tobacco: Never Used  . Alcohol use No     Comment: rare  . Drug use: No  . Sexual activity: Yes    Partners: Male    Birth control/ protection: Pill     Comment: micronor   Other Topics Concern  . Not on file   Social History Narrative  . No narrative on file    ROS:  Pertinent items are noted in HPI.  PHYSICAL EXAMINATION:    BP 120/80 (BP Location: Right Arm, Patient Position: Sitting, Cuff Size: Normal)   Pulse 82   Resp 16   Ht 5\' 5"  (1.651 m)   Wt 212 lb 3.2 oz  (96.3 kg)   LMP 04/04/2016 (Exact Date)   BMI 35.31 kg/m     General appearance: alert, cooperative and appears stated age   ASSESSMENT  Overactive bladder.  No stress incontinence.  Doubt fistula.   PLAN  Discussed overactive bladder.  Start Ditropan XL 10 mg daily.  Discussed Se of dry mouth, constipation, and urinary retention.  Avoid caffeine.  Follow up in 6 weeks, sooner as needed.   An After Visit Summary was printed and given to the patient.  __15____ minutes face to face time of which over 50% was spent in counseling.

## 2016-04-27 ENCOUNTER — Encounter: Payer: Self-pay | Admitting: Obstetrics and Gynecology

## 2016-04-27 ENCOUNTER — Ambulatory Visit (INDEPENDENT_AMBULATORY_CARE_PROVIDER_SITE_OTHER): Payer: BLUE CROSS/BLUE SHIELD | Admitting: Obstetrics and Gynecology

## 2016-04-27 VITALS — BP 120/80 | HR 82 | Resp 16 | Ht 65.0 in | Wt 212.2 lb

## 2016-04-27 DIAGNOSIS — N3281 Overactive bladder: Secondary | ICD-10-CM | POA: Diagnosis not present

## 2016-04-27 MED ORDER — OXYBUTYNIN CHLORIDE ER 10 MG PO TB24: 10.0000 mg | ORAL_TABLET | Freq: Every day | ORAL | 1 refills | Status: DC

## 2016-04-27 NOTE — Patient Instructions (Signed)
Take the Ditropan XL 10 mg tablet every morning.

## 2016-05-01 ENCOUNTER — Encounter: Payer: Self-pay | Admitting: Obstetrics and Gynecology

## 2016-06-11 ENCOUNTER — Ambulatory Visit: Payer: BLUE CROSS/BLUE SHIELD | Admitting: Obstetrics and Gynecology

## 2016-06-13 NOTE — Progress Notes (Signed)
GYNECOLOGY  VISIT   HPI: 51 y.o.   Married  Caucasian  female   G2P2002 with Patient's last menstrual period was 05/30/2016 (exact date).   here for 6 week follow up on Ditropan XL 10 mg.   States the medicine is not working.  Only spreads out her bathroom visits.  Can go from 7:00 until 12:30.  Leaks in between bathroom visits.  States she is just wet all the time, but activity may increase this.  Color is yellow.   No leakage with cough or sneeze.   Leakage is better when she is lying down on the bed. Movement in the bed makes her leak.  She did a tampon test and she did not see any color on the tampon or in her urine.  Thinks she bought the wrong AZO.  Consumes one caffeine 16 oz soda per day.   GYNECOLOGIC HISTORY: Patient's last menstrual period was 05/30/2016 (exact date). Contraception: OCPs--Micronor Menopausal hormone therapy:  n/a Last mammogram:  03-30-16 Density C/Neg/BiRads1:TBC  Last pap smear: 03-01-14 Neg:Neg HR HPV, 02-11-12 Neg:Neg HR HPV        OB History    Gravida Para Term Preterm AB Living   2 2 2  0 0 2   SAB TAB Ectopic Multiple Live Births   0 0 0 0 2         Patient Active Problem List   Diagnosis Date Noted  . Menorrhagia 08/16/2012    Past Medical History:  Diagnosis Date  . DDD (degenerative disc disease), lumbar 11/2008  . Diabetes mellitus 03/2010   Dr. Chalmers Cater  . Fatty liver 12/11   bx revealed--granuloma  . Hyperlipidemia 07/2010  . Hypertension   . Polymyalgia ([place])   . Vitamin D deficiency disease 2009    Past Surgical History:  Procedure Laterality Date  . CESAREAN SECTION  10/89 & 5/96  . LUMBAR SPINE SURGERY  11/19/2012   L4-L5 fusion  . WISDOM TOOTH EXTRACTION  age 69    Current Outpatient Prescriptions  Medication Sig Dispense Refill  . calcium carbonate (OS-CAL) 600 MG TABS Take 600 mg by mouth 2 (two) times daily with a meal.    . lisinopril-hydrochlorothiazide (PRINZIDE,ZESTORETIC) 20-25 MG per tablet Take 1  tablet by mouth daily.    . meloxicam (MOBIC) 15 MG tablet Take 15 mg by mouth daily.    . metFORMIN (GLUCOPHAGE) 500 MG tablet Take 500 mg by mouth daily before lunch.     . Multiple Vitamin (MULTIVITAMIN) tablet Take 1 tablet by mouth daily.    . norethindrone (MICRONOR,CAMILA,ERRIN) 0.35 MG tablet Take 1 tablet (0.35 mg total) by mouth daily. 3 Package 4  . oxybutynin (DITROPAN XL) 10 MG 24 hr tablet Take 1 tablet (10 mg total) by mouth at bedtime. 30 tablet 1  . potassium chloride SA (K-DUR,KLOR-CON) 20 MEQ tablet Take 20 mEq by mouth 2 (two) times daily.   0  . traMADol (ULTRAM) 50 MG tablet   0  . Vitamin D, Ergocalciferol, (DRISDOL) 50000 units CAPS capsule Take 1 capsule (50,000 Units total) by mouth every 7 (seven) days. 30 capsule 3   No current facility-administered medications for this visit.      ALLERGIES: Cephalexin  Family History  Problem Relation Age of Onset  . Thyroid disease Mother   . Hypertension Mother   . Diabetes Maternal Grandmother   . Coronary artery disease Maternal Grandmother   . Coronary artery disease Father   . Hypertension Sister   .  Coronary artery disease Maternal Grandfather   . Rheum arthritis Paternal Grandmother     Social History   Social History  . Marital status: Married    Spouse name: N/A  . Number of children: N/A  . Years of education: N/A   Occupational History  . Not on file.   Social History Main Topics  . Smoking status: Never Smoker  . Smokeless tobacco: Never Used  . Alcohol use No     Comment: rare  . Drug use: No  . Sexual activity: Yes    Partners: Male    Birth control/ protection: Pill     Comment: micronor   Other Topics Concern  . Not on file   Social History Narrative  . No narrative on file    ROS:  Pertinent items are noted in HPI.  PHYSICAL EXAMINATION:    BP 122/72 (BP Location: Right Arm, Patient Position: Sitting, Cuff Size: Large)   Pulse 76   Ht 5\' 5"  (1.651 m)   Wt 214 lb 9.6 oz  (97.3 kg)   LMP 05/30/2016 (Exact Date)   BMI 35.71 kg/m     General appearance: alert, cooperative and appears stated age  ASSESSMENT  Urinary incontinence.  Overactive bladder.  Fistula?   PLAN  Stop Ditropan Xl and switch to Sanctura 60 mg.  Will do tampon test with Pyridium.  Rx sent to pharmacy. Will have low threshold to do VCUG.  I reviewed stopping caffeine. Pelvic floor therapy and PTNS may also be appropriate.  She will call back to let me know how she is doing.   An After Visit Summary was printed and given to the patient.  ___25___ minutes face to face time of which over 50% was spent in counseling.

## 2016-06-14 ENCOUNTER — Encounter: Payer: Self-pay | Admitting: Obstetrics and Gynecology

## 2016-06-14 ENCOUNTER — Ambulatory Visit (INDEPENDENT_AMBULATORY_CARE_PROVIDER_SITE_OTHER): Payer: BLUE CROSS/BLUE SHIELD | Admitting: Obstetrics and Gynecology

## 2016-06-14 VITALS — BP 122/72 | HR 76 | Ht 65.0 in | Wt 214.6 lb

## 2016-06-14 DIAGNOSIS — R32 Unspecified urinary incontinence: Secondary | ICD-10-CM | POA: Diagnosis not present

## 2016-06-14 MED ORDER — PHENAZOPYRIDINE HCL 200 MG PO TABS: ORAL_TABLET | ORAL | 0 refills | Status: DC

## 2016-06-14 MED ORDER — TROSPIUM CHLORIDE ER 60 MG PO CP24: 60.0000 mg | ORAL_CAPSULE | Freq: Every day | ORAL | 1 refills | Status: DC

## 2016-06-15 ENCOUNTER — Telehealth: Payer: Self-pay | Admitting: Obstetrics and Gynecology

## 2016-06-15 NOTE — Telephone Encounter (Addendum)
Michael from [facility] called with concerns about possible interactions patient may have with Trospium since she is taking a potasium supplement.

## 2016-06-15 NOTE — Telephone Encounter (Signed)
Spoke with patient, advised as seen below per Dr. Quincy Simmonds. Patient states she will do the pad/tampon test over the weekend and report back first of the week. Patient verbalizes understanding and is agreeable. Patient thankful for return call.   Routing to provider for final review. Patient is agreeable to disposition. Will close encounter.

## 2016-06-15 NOTE — Telephone Encounter (Signed)
On review with a drug interaction program, most of the anticholinergics we use to treat urinary incontinence interact with potassium and can cause the potassium level to increase.  The only medication that does not increase potassium levels in Myrbetriq, which cannot be used if blood pressure is > 180/110.   This medication can increase blood pressure. I would recommend she not take the Laurium. I would like for her to do the tampon/pad test and report back to me.  We may need to approach her incontinence differently and rule out fistula formation first.

## 2016-06-15 NOTE — Telephone Encounter (Signed)
Spoke with Legrand Como at [facility]. Was advised could be possible interactions with potassium and trospium. Was advised patient was on oxybutin before, may have same effect, want to make sure provider is aware. Wyona Almas will review with Dr. Quincy Simmonds and return call with recommendations.   Dr. Quincy Simmonds -please review and advise?

## 2016-06-18 ENCOUNTER — Other Ambulatory Visit: Payer: Self-pay | Admitting: *Deleted

## 2016-06-18 NOTE — Telephone Encounter (Signed)
Medication refill request: Oxybutynin  Last AEX:  03/27/16 PG Next AEX: 04/05/17  Last MMG (if hormonal medication request): 03/30/16 BIRADS1:neg  Refill authorized: per Dr. Quincy Simmonds on [place] 06/14/16 "Stop Ditropan Xl and switch to Sanctura 60 mg"  Rx refused.

## 2017-03-18 ENCOUNTER — Other Ambulatory Visit: Payer: Self-pay | Admitting: Obstetrics and Gynecology

## 2017-03-18 DIAGNOSIS — Z139 Encounter for screening, unspecified: Secondary | ICD-10-CM

## 2017-04-05 ENCOUNTER — Ambulatory Visit: Payer: BLUE CROSS/BLUE SHIELD | Admitting: Obstetrics and Gynecology

## 2017-04-05 ENCOUNTER — Other Ambulatory Visit (HOSPITAL_COMMUNITY)
Admission: RE | Admit: 2017-04-05 | Discharge: 2017-04-05 | Disposition: A | Discharge status: Home / Self Care | Payer: BLUE CROSS/BLUE SHIELD | Source: Ambulatory Visit | Attending: Obstetrics and Gynecology | Admitting: Obstetrics and Gynecology

## 2017-04-05 ENCOUNTER — Ambulatory Visit: Payer: BLUE CROSS/BLUE SHIELD | Admitting: Nurse Practitioner

## 2017-04-05 ENCOUNTER — Encounter: Payer: Self-pay | Admitting: Obstetrics and Gynecology

## 2017-04-05 ENCOUNTER — Other Ambulatory Visit: Payer: Self-pay

## 2017-04-05 VITALS — BP 132/78 | HR 64 | Resp 16 | Ht 65.0 in | Wt 209.0 lb

## 2017-04-05 DIAGNOSIS — Z01419 Encounter for gynecological examination (general) (routine) without abnormal findings: Secondary | ICD-10-CM | POA: Insufficient documentation

## 2017-04-05 DIAGNOSIS — N3281 Overactive bladder: Secondary | ICD-10-CM

## 2017-04-05 DIAGNOSIS — N9089 Other specified noninflammatory disorders of vulva and perineum: Secondary | ICD-10-CM

## 2017-04-05 DIAGNOSIS — E041 Nontoxic single thyroid nodule: Secondary | ICD-10-CM

## 2017-04-05 MED ORDER — MIRABEGRON ER 25 MG PO TB24: 25.0000 mg | ORAL_TABLET | Freq: Every day | ORAL | 1 refills | Status: DC

## 2017-04-05 MED ORDER — NORETHINDRONE 0.35 MG PO TABS: 1.0000 | ORAL_TABLET | Freq: Every day | ORAL | 3 refills | Status: DC

## 2017-04-05 NOTE — Progress Notes (Signed)
Patient scheduled while in office. Spoke with [place] at [place]. Patient scheduled for Thyroid US on 2/11 arriving at 10:55am for 11:15am appt, [address] location. Patient verbalizes understanding and is agreeable.

## 2017-04-05 NOTE — Patient Instructions (Signed)
EXERCISE AND DIET:  We recommended that you start or continue a regular exercise program for good health. Regular exercise means any activity that makes your heart beat faster and makes you sweat.  We recommend exercising at least 30 minutes per day at least 3 days a week, preferably 4 or 5.  We also recommend a diet low in fat and sugar.  Inactivity, poor dietary choices and obesity can cause diabetes, heart attack, stroke, and kidney damage, among others.    ALCOHOL AND SMOKING:  Women should limit their alcohol intake to no more than 7 drinks/beers/glasses of wine (combined, not each!) per week. Moderation of alcohol intake to this level decreases your risk of breast cancer and liver damage. And of course, no recreational drugs are part of a healthy lifestyle.  And absolutely no smoking or even second hand smoke. Most people know smoking can cause heart and lung diseases, but did you know it also contributes to weakening of your bones? Aging of your skin?  Yellowing of your teeth and nails?  CALCIUM AND VITAMIN D:  Adequate intake of calcium and Vitamin D are recommended.  The recommendations for exact amounts of these supplements seem to change often, but generally speaking 600 mg of calcium (either carbonate or citrate) and 800 units of Vitamin D per day seems prudent. Certain women may benefit from higher intake of Vitamin D.  If you are among these women, your doctor will have told you during your visit.    PAP SMEARS:  Pap smears, to check for cervical cancer or precancers,  have traditionally been done yearly, although recent scientific advances have shown that most women can have pap smears less often.  However, every woman still should have a physical exam from her gynecologist every year. It will include a breast check, inspection of the vulva and vagina to check for abnormal growths or skin changes, a visual exam of the cervix, and then an exam to evaluate the size and shape of the uterus and  ovaries.  And after 51 years of age, a rectal exam is indicated to check for rectal cancers. We will also provide age appropriate advice regarding health maintenance, like when you should have certain vaccines, screening for sexually transmitted diseases, bone density testing, colonoscopy, mammograms, etc.   MAMMOGRAMS:  All women over 40 years old should have a yearly mammogram. Many facilities now offer a "3D" mammogram, which may cost around $50 extra out of pocket. If possible,  we recommend you accept the option to have the 3D mammogram performed.  It both reduces the number of women who will be called back for extra views which then turn out to be normal, and it is better than the routine mammogram at detecting truly abnormal areas.    COLONOSCOPY:  Colonoscopy to screen for colon cancer is recommended for all women at age 50.  We know, you hate the idea of the prep.  We agree, BUT, having colon cancer and not knowing it is worse!!  Colon cancer so often starts as a polyp that can be seen and removed at colonscopy, which can quite literally save your life!  And if your first colonoscopy is normal and you have no family history of colon cancer, most women don't have to have it again for 10 years.  Once every ten years, you can do something that may end up saving your life, right?  We will be happy to help you get it scheduled when you are ready.    Be sure to check your insurance coverage so you understand how much it will cost.  It may be covered as a preventative service at no cost, but you should check your particular policy.     Mirabegron extended-release tablets What is this medicine? MIRABEGRON (MIR a BEG ron) is used to treat overactive bladder. This medicine reduces the amount of bathroom visits. It may also help to control wetting accidents. This medicine may be used for other purposes; ask your health care provider or pharmacist if you have questions. COMMON BRAND NAME(S): Myrbetriq What  should I tell my health care provider before I take this medicine? They need to know if you have any of these conditions: -difficulty passing urine -high blood pressure -kidney disease -liver disease -an unusual or allergic reaction to mirabegron, other medicines, foods, dyes, or preservatives -pregnant or trying to get pregnant -breast-feeding How should I use this medicine? Take this medicine by mouth with a glass of water. Follow the directions on the prescription label. Do not cut, crush or chew this medicine. You can take it with or without food. If it upsets your stomach, take it with food. Take your medicine at regular intervals. Do not take it more often than directed. Do not stop taking except on your doctor's advice. Talk to your pediatrician regarding the use of this medicine in children. Special care may be needed. Overdosage: If you think you have taken too much of this medicine contact a poison control center or emergency room at once. NOTE: This medicine is only for you. Do not share this medicine with others. What if I miss a dose? If you miss a dose, take it as soon as you can. If it is almost time for your next dose, take only that dose. Do not take double or extra doses. What may interact with this medicine? -certain medicines for bladder problems like fesoterodine, oxybutynin, solifenacin, tolterodine -desipramine -digoxin -flecainide -ketoconazole -MAOIs like Carbex, Eldepryl, Marplan, Nardil, and Parnate -metoprolol -propafenone -thioridazine -warfarin This list may not describe all possible interactions. Give your health care provider a list of all the medicines, herbs, non-prescription drugs, or dietary supplements you use. Also tell them if you smoke, drink alcohol, or use illegal drugs. Some items may interact with your medicine. What should I watch for while using this medicine? It may take 8 weeks to notice the full benefit from this medicine. You may need to  limit your intake tea, coffee, caffeinated sodas, and alcohol. These drinks may make your symptoms worse. Visit your doctor or health care professional for regular checks on your progress. Check your blood pressure as directed. Ask your doctor or health care professional what your blood pressure should be and when you should contact him or her. What side effects may I notice from receiving this medicine? Side effects that you should report to your doctor or health care professional as soon as possible: -allergic reactions like skin rash, itching or hives, swelling of the face, lips, or tongue -chest pain or palpitations -severe or sudden headache -high blood pressure -fast, irregular heartbeat -redness, blistering, peeling or loosening of the skin, including inside the mouth -signs of infection like fever or chills; cough; sore throat; pain or difficulty passing urine -trouble passing urine or change in the amount of urine Side effects that usually do not require medical attention (report to your doctor or health care professional if they continue or are bothersome): -constipation -diarrhea -dizziness -dry eyes -joint pain -mild headache -nausea -runny  nose This list may not describe all possible side effects. Call your doctor for medical advice about side effects. You may report side effects to FDA at 1-800-FDA-1088. Where should I keep my medicine? Keep out of the reach of children. Store at room temperature between 15 and 30 degrees C (59 and 86 degrees F). Throw away any unused medicine after the expiration date. NOTE: This sheet is a summary. It may not cover all possible information. If you have questions about this medicine, talk to your doctor, pharmacist, or health care provider.  2018 Elsevier/Gold Standard (2015-03-17 12:14:30)

## 2017-04-05 NOTE — Progress Notes (Signed)
52 y.o. G41P2002 Married Caucasian female here for annual exam.    Menses are still regular.  Can have 2 per month close together.  This has occurred 2 times per year.   On Micronor.  No skipped or late pills.  Sporadic hot flashes.   Notes a boil in her vaginal area and goes away.  Painful, drains. Occur spontaneously.  None currently.  Do not occur under her arms.  Occurring for about a year.   Has hx of HSV 1.   Still leaking urine spontaneously.  Has frequency.  Cannot even tell when she is leaking.  Wants to try another medication.   Labs with Dr. Suzette Battiest.    Has an appointment with her next week.  ROS - loss of urine.   PCP:   Dr. Arelia Sneddon  Patient's last menstrual period was 03/23/2017 (within weeks).           Sexually active: Yes.    The current method of family planning is Micronor.    Exercising: Yes.    walking Smoker:  no  Health Maintenance: Pap:  03/01/14, Negative with neg HR HPV History of abnormal Pap:  no MMG:  03/30/16 BIRADS 1 negative/denisty c -- scheduled 04/12/17 Colonoscopy:  2019 patient did stool kit, per patient negative TDaP:  2013 HIV: 1996 with pregnancy Hep C: about 4 years ago per patient negative Screening Labs:  PCP -- patient had labs done yesterday   reports that  has never smoked. she has never used smokeless tobacco. She reports that she does not drink alcohol or use drugs.  Past Medical History:  Diagnosis Date  . DDD (degenerative disc disease), lumbar 11/2008  . Diabetes mellitus 03/2010   Dr. Chalmers Cater  . Fatty liver 12/11   bx revealed--granuloma  . Hyperlipidemia 07/2010  . Hypertension   . Polymyalgia ([place])   . Vitamin D deficiency disease 2009    Past Surgical History:  Procedure Laterality Date  . CESAREAN SECTION  10/89 & 5/96  . LUMBAR SPINE SURGERY  11/19/2012   L4-L5 fusion  . WISDOM TOOTH EXTRACTION  age 35    Current Outpatient Medications  Medication Sig Dispense Refill  . calcium carbonate (OS-CAL) 600  MG TABS Take 600 mg by mouth 2 (two) times daily with a meal.    . Cyanocobalamin (B-12) 5000 MCG CAPS Take by mouth daily.    Marland Kitchen lisinopril-hydrochlorothiazide (PRINZIDE,ZESTORETIC) 20-25 MG per tablet Take 1 tablet by mouth daily.    . meloxicam (MOBIC) 15 MG tablet Take 15 mg by mouth daily.    . metFORMIN (GLUCOPHAGE) 500 MG tablet Take 500 mg by mouth daily before lunch.     . Multiple Vitamin (MULTIVITAMIN) tablet Take 1 tablet by mouth daily.    . norethindrone (MICRONOR,CAMILA,ERRIN) 0.35 MG tablet Take 1 tablet (0.35 mg total) by mouth daily. 3 Package 4  . potassium chloride SA (K-DUR,KLOR-CON) 20 MEQ tablet Take 20 mEq by mouth 2 (two) times daily.   0  . traMADol (ULTRAM) 50 MG tablet   0  . Vitamin D, Ergocalciferol, (DRISDOL) 50000 units CAPS capsule Take 1 capsule (50,000 Units total) by mouth every 7 (seven) days. 30 capsule 3   No current facility-administered medications for this visit.     Family History  Problem Relation Age of Onset  . Thyroid disease Mother   . Hypertension Mother   . Diabetes Maternal Grandmother   . Coronary artery disease Maternal Grandmother   . Coronary artery disease Father   .  Hypertension Sister   . Coronary artery disease Maternal Grandfather   . Rheum arthritis Paternal Grandmother     ROS:  Pertinent items are noted in HPI.  Otherwise, a comprehensive ROS was negative.  Exam:   BP 132/78 (BP Location: Right Arm, Patient Position: Sitting, Cuff Size: Normal)   Pulse 64   Resp 16   Ht _0  (1.651 m)   Wt 209 lb (94.8 kg)   LMP 03/23/2017 (Within Weeks)   BMI 34.78 kg/m     General appearance: alert, cooperative and appears stated age Head: Normocephalic, without obvious abnormality, atraumatic Neck: no adenopathy, supple, symmetrical, trachea midline and thyroid  Enlarged thyroid with left thyroid nodule. ungs: clear to auscultation bilaterally Breasts: normal appearance, no masses or tenderness, No nipple retraction or  dimpling, No nipple discharge or bleeding, No axillary or supraclavicular adenopathy Heart: regular rate and rhythm Abdomen: soft, non-tender; no masses, no organomegaly Extremities: extremities normal, atraumatic, no cyanosis or edema Skin: Skin color, texture, turgor normal. No rashes or lesions Lymph nodes: Cervical, supraclavicular, and axillary nodes normal. No abnormal inguinal nodes palpated Neurologic: Grossly normal  Pelvic: External genitalia:  Raised ulcer of the right labia majora - 4 mm.               Urethra:  normal appearing urethra with no masses, tenderness or lesions              Bartholins and Skenes: normal                 Vagina: normal appearing vagina with normal color and discharge, no lesions              Cervix: no lesions              Pap taken: Yes.   Bimanual Exam:  Uterus:  normal size, contour, position, consistency, mobility, non-tender              Adnexa: no mass, fullness, tenderness              Rectal exam: Yes.  .  Confirms.              Anus:  normal sphincter tone, no lesions  Chaperone was present for exam.  Assessment:   Well woman visit with normal exam. Overactive bladder.  HTN.  Well controlled.  On Micronor. Vulvar ulcer.  Enlarged thyroid with left thyroid nodule.  Plan: Mammogram screening discussed. Recommended self breast awareness. Pap and HR HPV as above. Guidelines for Calcium, Vitamin D, regular exercise program including cardiovascular and weight bearing exercise. Labs with endocrinology.  Will order thyroid ultrasound and send copy to Dr. Suzette Battiest.  Serum HSV testing.  Refill of Micronor.  Rx for Mybetriq 25 mg.  Discussed potential for elevated blood pressure. Recheck in 4 weeks.  Follow up annually and prn.       After visit summary provided.

## 2017-04-06 LAB — HSV(HERPES SIMPLEX VRS) I + II AB-IGG: HSV 2 IgG, Type Spec: <0.91 index (ref 0.00–0.90)

## 2017-04-08 ENCOUNTER — Ambulatory Visit
Admission: RE | Admit: 2017-04-08 | Discharge: 2017-04-08 | Disposition: A | Discharge status: Home / Self Care | Payer: BLUE CROSS/BLUE SHIELD | Source: Ambulatory Visit | Attending: Obstetrics and Gynecology | Admitting: Obstetrics and Gynecology

## 2017-04-08 DIAGNOSIS — E041 Nontoxic single thyroid nodule: Secondary | ICD-10-CM

## 2017-04-09 ENCOUNTER — Telehealth: Payer: Self-pay

## 2017-04-09 LAB — CYTOLOGY - PAP
DIAGNOSIS: NEGATIVE
HPV (WINDOPATH): NOT DETECTED

## 2017-04-09 NOTE — Telephone Encounter (Signed)
Spoke with patient. Results given. Patient verbalizes understanding. Patient has an appointment with Dr.Balan on 04/11/2017 and will speak with her about this then to work on scheduling. Results faxed to [facility] office with notation that the patient has an appointment on 04/11/2017 and will need assessment and help scheduling this appointment.  Routing to provider for final review. Patient agreeable to disposition. Will close encounter.

## 2017-04-09 NOTE — Telephone Encounter (Signed)
Please keep patient in imaging hold until we receive confirmation from Dr. Chalmers Cater that she has been seen and evaluated for thyroid biopsy.

## 2017-04-09 NOTE — Telephone Encounter (Signed)
Placed in imaging hold.  ?

## 2017-04-09 NOTE — Telephone Encounter (Signed)
-----   Message from Nunzio Cobbs, MD sent at 04/08/2017  1:56 PM EST ----- Please contact patient in follow up to her thyroid ultrasound showing a right thyroid nodule that needs biopsy.   Patient is established with Dr. Suzette Battiest for thyroid care.  She will need to be evaluated for biopsy of the thyroid, which I would like Dr. Suzette Battiest to manage.  Please contact her office and send a copy of this report.

## 2017-04-11 ENCOUNTER — Other Ambulatory Visit: Payer: Self-pay | Admitting: Endocrinology

## 2017-04-11 DIAGNOSIS — E7800 Pure hypercholesterolemia, unspecified: Secondary | ICD-10-CM

## 2017-04-12 ENCOUNTER — Telehealth: Payer: Self-pay | Admitting: Obstetrics and Gynecology

## 2017-04-12 ENCOUNTER — Ambulatory Visit
Admission: RE | Admit: 2017-04-12 | Discharge: 2017-04-12 | Disposition: A | Discharge status: Home / Self Care | Payer: BLUE CROSS/BLUE SHIELD | Source: Ambulatory Visit | Attending: Obstetrics and Gynecology | Admitting: Obstetrics and Gynecology

## 2017-04-12 DIAGNOSIS — Z139 Encounter for screening, unspecified: Secondary | ICD-10-CM

## 2017-04-12 NOTE — Telephone Encounter (Signed)
Patient has not started her Mybetric because her insurance company has rejected the prescription. It will need a prior-authorization. She will reschedule her one month medication recheck once she starts the medication.

## 2017-04-12 NOTE — Telephone Encounter (Signed)
Prior authorization has been submitted via [employer] for Myrbetriq. Patient has been notified, could take up to 3 business days before getting a response from plan.

## 2017-04-15 ENCOUNTER — Telehealth: Payer: Self-pay | Admitting: *Deleted

## 2017-04-15 NOTE — Telephone Encounter (Signed)
[facility] has questions regarding prior authorization.

## 2017-04-15 NOTE — Telephone Encounter (Signed)
Additional PA forms faxed to [facility] at [phone].

## 2017-04-15 NOTE — Telephone Encounter (Signed)
Additional information received from [facility] regarding PA for Myrbetriq. Forms completed and to Dr. Quincy Simmonds for signature.

## 2017-04-15 NOTE — Telephone Encounter (Signed)
Opened in error, will close encounter

## 2017-04-17 NOTE — Telephone Encounter (Signed)
Lynette from [facility] calling to advise PA for Myrbetriq was denied, will send fax to [phone].   Routing to Dr. Antony Blackbird, will await fax.   Cc: Terence Lux, CMA

## 2017-04-18 ENCOUNTER — Ambulatory Visit
Admission: RE | Admit: 2017-04-18 | Discharge: 2017-04-18 | Disposition: A | Discharge status: Home / Self Care | Payer: BLUE CROSS/BLUE SHIELD | Source: Ambulatory Visit | Attending: Endocrinology | Admitting: Endocrinology

## 2017-04-18 ENCOUNTER — Other Ambulatory Visit: Payer: Self-pay | Admitting: *Deleted

## 2017-04-18 ENCOUNTER — Other Ambulatory Visit (HOSPITAL_COMMUNITY)
Admission: RE | Admit: 2017-04-18 | Discharge: 2017-04-18 | Disposition: A | Discharge status: Home / Self Care | Payer: BLUE CROSS/BLUE SHIELD | Source: Ambulatory Visit | Attending: Radiology | Admitting: Radiology

## 2017-04-18 DIAGNOSIS — E041 Nontoxic single thyroid nodule: Secondary | ICD-10-CM | POA: Diagnosis present

## 2017-04-18 DIAGNOSIS — D34 Benign neoplasm of thyroid gland: Secondary | ICD-10-CM | POA: Diagnosis not present

## 2017-04-18 DIAGNOSIS — E7800 Pure hypercholesterolemia, unspecified: Secondary | ICD-10-CM

## 2017-04-18 NOTE — Telephone Encounter (Signed)
Left message to call Yigit Norkus at [phone].  

## 2017-04-18 NOTE — Telephone Encounter (Signed)
Other meds for overactive bladder are not recommended as they can increase potassium level. The others can interact with her potassium.  Myrbetriq is her best option. Please re-appeal this rx for her.

## 2017-04-18 NOTE — Telephone Encounter (Signed)
Spoke with patient, advised PA for Myrbetriq was denied. Reviewed explanation of basis for determination provided by [facility].  Patient is agreeable to trying alternative medication. Advised patient will review medications provided on formulary with Dr. Quincy Simmonds and return call, patient is agreeable.   Routing to Dr. Quincy Simmonds, please advise.

## 2017-04-18 NOTE — Telephone Encounter (Signed)
Please contact patient to have her schedule a lab visit to have her vit D level checked.  At this time, I am declining this refill for prescription vit D.

## 2017-04-18 NOTE — Telephone Encounter (Signed)
Left detailed message, ok per current dpr. Advised as seen below per Dr. Quincy Simmonds. Advised will submit appeal to [facility], will return call to notify once response received. Return call to office with any additional questions/concerns.   [employer] Appeal form completed, placed on Dr. Elza Rafter desk for review and signature.

## 2017-04-18 NOTE — Telephone Encounter (Signed)
Detailed message left on cell number per DPR with message as seen below from Dr. Quincy Simmonds. Advised to return call to schedule lab appointment.

## 2017-04-18 NOTE — Telephone Encounter (Signed)
Medication refill request: vitamin d  Last AEX:  04/05/17 BS  Next AEX: 04/21/18  Last MMG (if hormonal medication request): 04/12/17 density b/BIRADS 1 negative  Refill authorized: 03/27/16 #30, 3 RF. Today, please advise.

## 2017-04-19 ENCOUNTER — Telehealth: Payer: Self-pay | Admitting: *Deleted

## 2017-04-19 NOTE — Telephone Encounter (Signed)
Patient returning call in regard to Vitamin D refill request, see closed refill encounter dated 04/18/17.   Patient states she had vitamin D labs drawn at PCP on 04/04/17, will fax recent copy to office at [phone].  Advised patient once received will send to Dr. Quincy Simmonds to review, will return call with recommendations, patient verbalizes understanding and is agreeable.

## 2017-04-19 NOTE — Telephone Encounter (Signed)
Her vit D level is normal.  I recommend she take over the counter vit D 800 IU daily.  This can be rechecked again at her annual exam.

## 2017-04-19 NOTE — Telephone Encounter (Signed)
Vitamin D lab results received, to Dr. Quincy Simmonds for review.   04/04/17 -Vitamin D 54  Dr. Quincy Simmonds -please advise on Vitamin D.

## 2017-04-19 NOTE — Telephone Encounter (Signed)
Left detailed message, ok per current dpr, name identified on voicemail. Advised as seen below per Dr. Quincy Simmonds, no RX recommended. Return call to office at [phone] for any additional questions.   Routing to provider for final review. Patient is agreeable to disposition. Will close encounter.

## 2017-04-23 NOTE — Telephone Encounter (Signed)
Spoke with patient. Patient states she had vitamin d level done with PCP and faxed to our office. See telephone encounter dated 04/19/17. Patient to be on OTC vitamin d and will have lab rechecked at [facility] next year per Dr. Quincy Simmonds.   Patient agreeable to disposition. Will close encounter.

## 2017-04-23 NOTE — Telephone Encounter (Signed)
Late entry: Appeal faxed to [facility] on 04/18/17.

## 2017-05-03 ENCOUNTER — Ambulatory Visit: Payer: BLUE CROSS/BLUE SHIELD | Admitting: Obstetrics and Gynecology

## 2017-05-06 NOTE — Telephone Encounter (Signed)
Routing to triage for review. Has this been completed?

## 2017-05-20 NOTE — Telephone Encounter (Signed)
Spoke with patient, advised as seen below per Dr. Quincy Simmonds, provided update for Mybetriq appeal. Patient verbalizes understanding and is agreeable. Will close encounter.

## 2017-05-20 NOTE — Telephone Encounter (Addendum)
Spoke with patient.   1. Patient has not received a response from [facility] regarding appeal for Mybetriq, advised RN will f/u on status of appeal and return call.   2. Reports f/u with Dr. Chalmers Cater for thyroid bx, benign. Patient reports occasional difficulty with swallowing, is unsure if r/t to thyroid nodule? Asking if "just let the nodule sit there, options" ?  Advised patient to f/u with Dr. Chalmers Cater regarding options for tx of thyroid nodule. F/u with PCP for evaluation of swallowing. Dr. Quincy Simmonds will review I will return call with any additional recommendations.   Routing to Dr. Quincy Simmonds

## 2017-05-20 NOTE — Telephone Encounter (Signed)
Thank you for the update regarding the appeal for the Myrbetriq and for the swallowing issues for the patient.  I agree she follow up with endocrinology or PCP regarding her throat issues.

## 2017-05-20 NOTE — Telephone Encounter (Signed)
Spoke with Meghan Lyons. At [facility] Regarding appeal for Myrbetriq. Was advised appeal processed on 04/29/17, can take up to30 days  for decision, target date for decision is 05/29/17. Will receive decision via fax or mail on or before 05/29/17. Was provided appeal [phone].

## 2017-06-03 ENCOUNTER — Telehealth: Payer: Self-pay | Admitting: Obstetrics and Gynecology

## 2017-06-03 NOTE — Telephone Encounter (Signed)
Left message to call Kavir Savoca at [phone].  

## 2017-06-03 NOTE — Telephone Encounter (Signed)
Please contact patient.   Notification from [facility] that [place] is denied.   I would like to offer the patient a referral to Dr. Matilde Sprang at [facility] for her urinary incontinence.

## 2017-06-03 NOTE — Telephone Encounter (Signed)
Patient returned call to Jill. °

## 2017-06-03 NOTE — Telephone Encounter (Signed)
Spoke with patient, advised as seen below per Dr. Quincy Simmonds. Patient declines referral to Dr. Matilde Sprang at this time, has a new grand baby on the way. Patient will return call to office when ready to proceed.   No orders placed.   Routing to provider for final review. Patient is agreeable to disposition. Will close encounter.

## 2017-11-20 ENCOUNTER — Other Ambulatory Visit: Payer: Self-pay | Admitting: Internal Medicine

## 2017-11-21 ENCOUNTER — Other Ambulatory Visit: Payer: Self-pay | Admitting: Internal Medicine

## 2017-11-21 DIAGNOSIS — M25551 Pain in right hip: Secondary | ICD-10-CM

## 2017-11-27 ENCOUNTER — Other Ambulatory Visit: Payer: Self-pay | Admitting: Internal Medicine

## 2017-11-27 DIAGNOSIS — R2241 Localized swelling, mass and lump, right lower limb: Secondary | ICD-10-CM

## 2017-11-29 ENCOUNTER — Other Ambulatory Visit: Payer: Self-pay | Admitting: Internal Medicine

## 2017-11-29 ENCOUNTER — Ambulatory Visit
Admission: RE | Admit: 2017-11-29 | Discharge: 2017-11-29 | Disposition: A | Discharge status: Home / Self Care | Payer: BLUE CROSS/BLUE SHIELD | Source: Ambulatory Visit | Attending: Internal Medicine | Admitting: Internal Medicine

## 2017-11-29 DIAGNOSIS — R2241 Localized swelling, mass and lump, right lower limb: Secondary | ICD-10-CM

## 2018-03-05 ENCOUNTER — Other Ambulatory Visit: Payer: Self-pay | Admitting: Obstetrics and Gynecology

## 2018-03-05 DIAGNOSIS — Z1231 Encounter for screening mammogram for malignant neoplasm of breast: Secondary | ICD-10-CM

## 2018-03-31 ENCOUNTER — Other Ambulatory Visit: Payer: Self-pay | Admitting: Obstetrics and Gynecology

## 2018-03-31 MED ORDER — NORETHINDRONE 0.35 MG PO TABS: 1.0000 | ORAL_TABLET | Freq: Every day | ORAL | 0 refills | Status: DC

## 2018-03-31 NOTE — Telephone Encounter (Signed)
Call to patient. Patient updated refill of POP sent to Pharmacy. Patient agreeable and appreciative of phone call.

## 2018-03-31 NOTE — Telephone Encounter (Signed)
Medication refill request: Norethindrone 0.35mg  Last AEX:  04-05-17 BS  Next AEX: 04-24-2018 Last MMG (if hormonal medication request): 04-12-17 density B/BIRADS 1 negative  Refill authorized: 04-05-17 #3packs, 3RF. Today, please advise.   Medication pended for #1pack, 0RF as patient has aex scheduled for 04-24-2018. Please refill if appropriate.

## 2018-03-31 NOTE — Telephone Encounter (Signed)
Patient called to check on the status of a refill request she said should have been sent to the office over the weekend for her birth control. She said she is completely out and will need a refill as soon as possible. Patient aware we normally request 48 hours notice for refills. Pharmacy on file confirmed.

## 2018-04-17 ENCOUNTER — Ambulatory Visit
Admission: RE | Admit: 2018-04-17 | Discharge: 2018-04-17 | Disposition: A | Discharge status: Home / Self Care | Payer: BLUE CROSS/BLUE SHIELD | Source: Ambulatory Visit | Attending: Obstetrics and Gynecology | Admitting: Obstetrics and Gynecology

## 2018-04-17 DIAGNOSIS — Z1231 Encounter for screening mammogram for malignant neoplasm of breast: Secondary | ICD-10-CM

## 2018-04-21 ENCOUNTER — Ambulatory Visit: Payer: BLUE CROSS/BLUE SHIELD | Admitting: Obstetrics and Gynecology

## 2018-04-21 IMAGING — MG DIGITAL SCREENING BILATERAL MAMMOGRAM WITH CAD
4 series · 4 of 4 positions shown · non-contrast
Comparison: Previous exam(s).

CLINICAL DATA: Screening.

EXAM:
DIGITAL SCREENING BILATERAL MAMMOGRAM WITH CAD

[R CC]
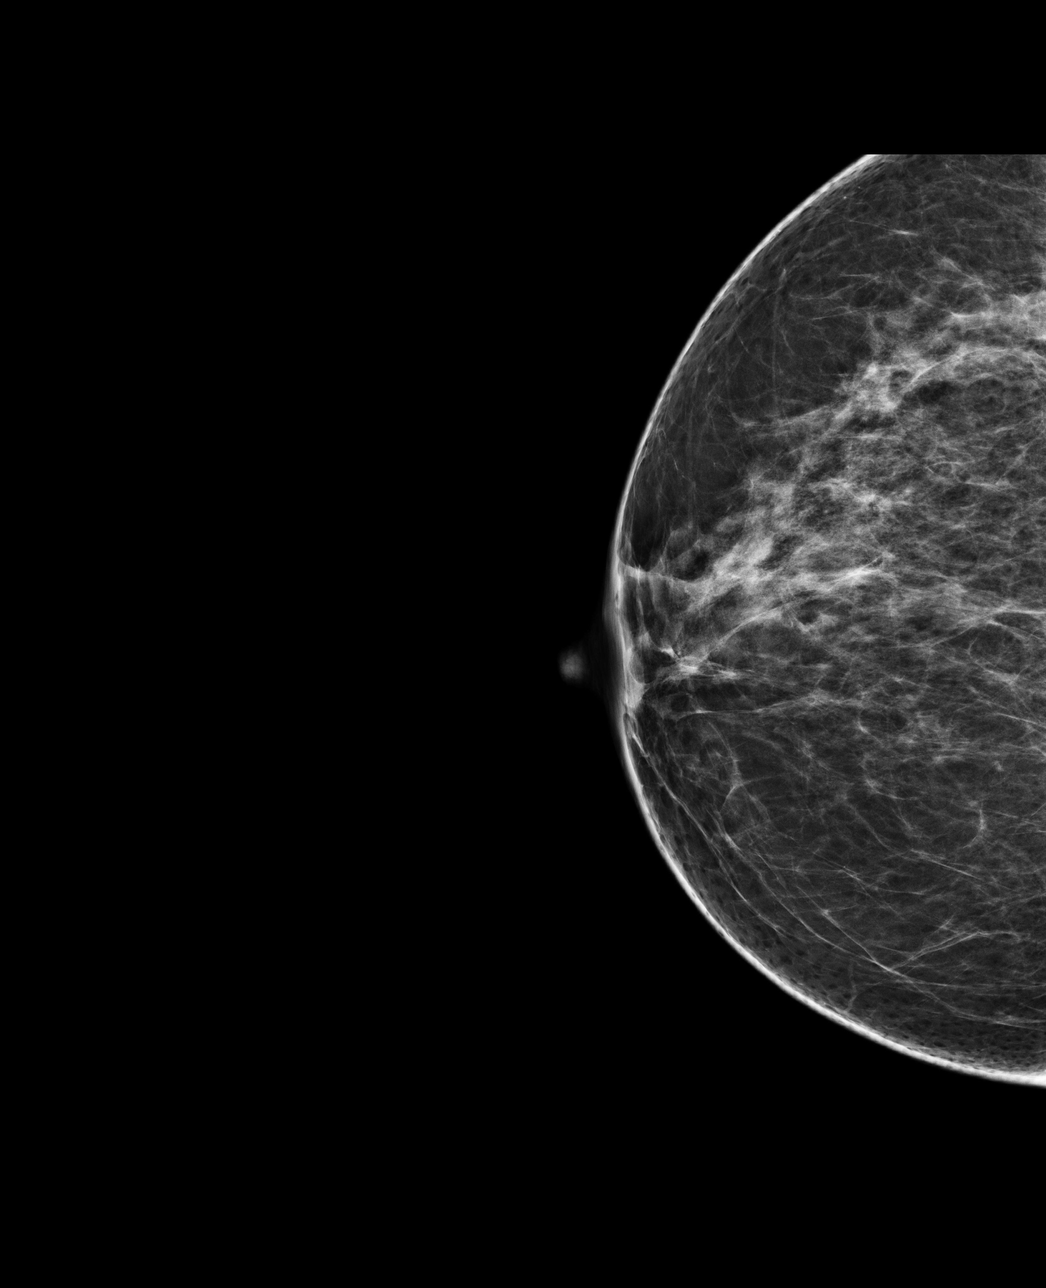

[L MLO]
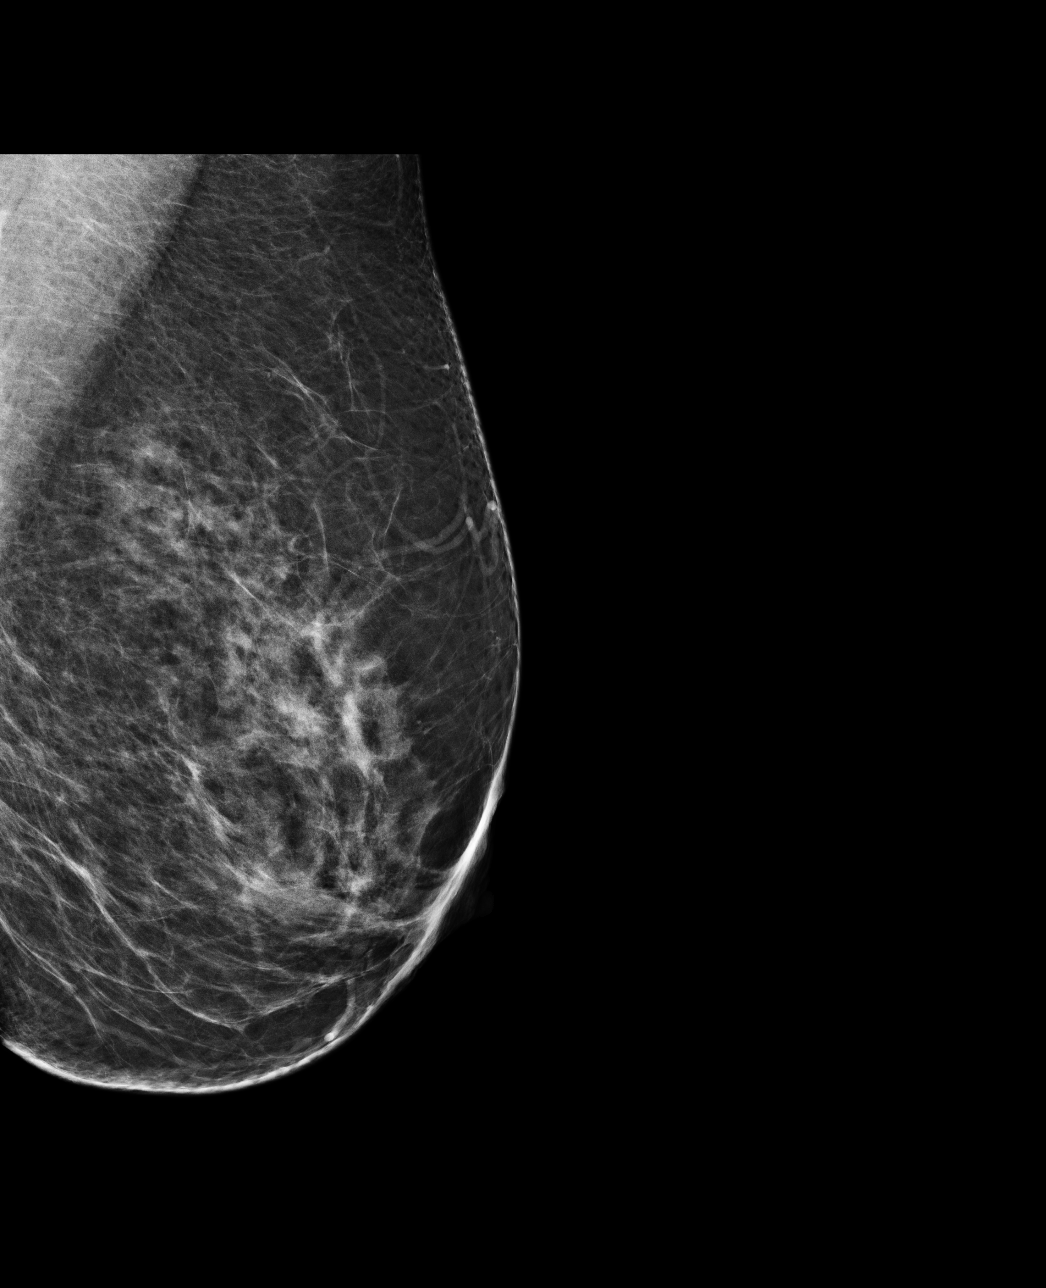

[L CC]
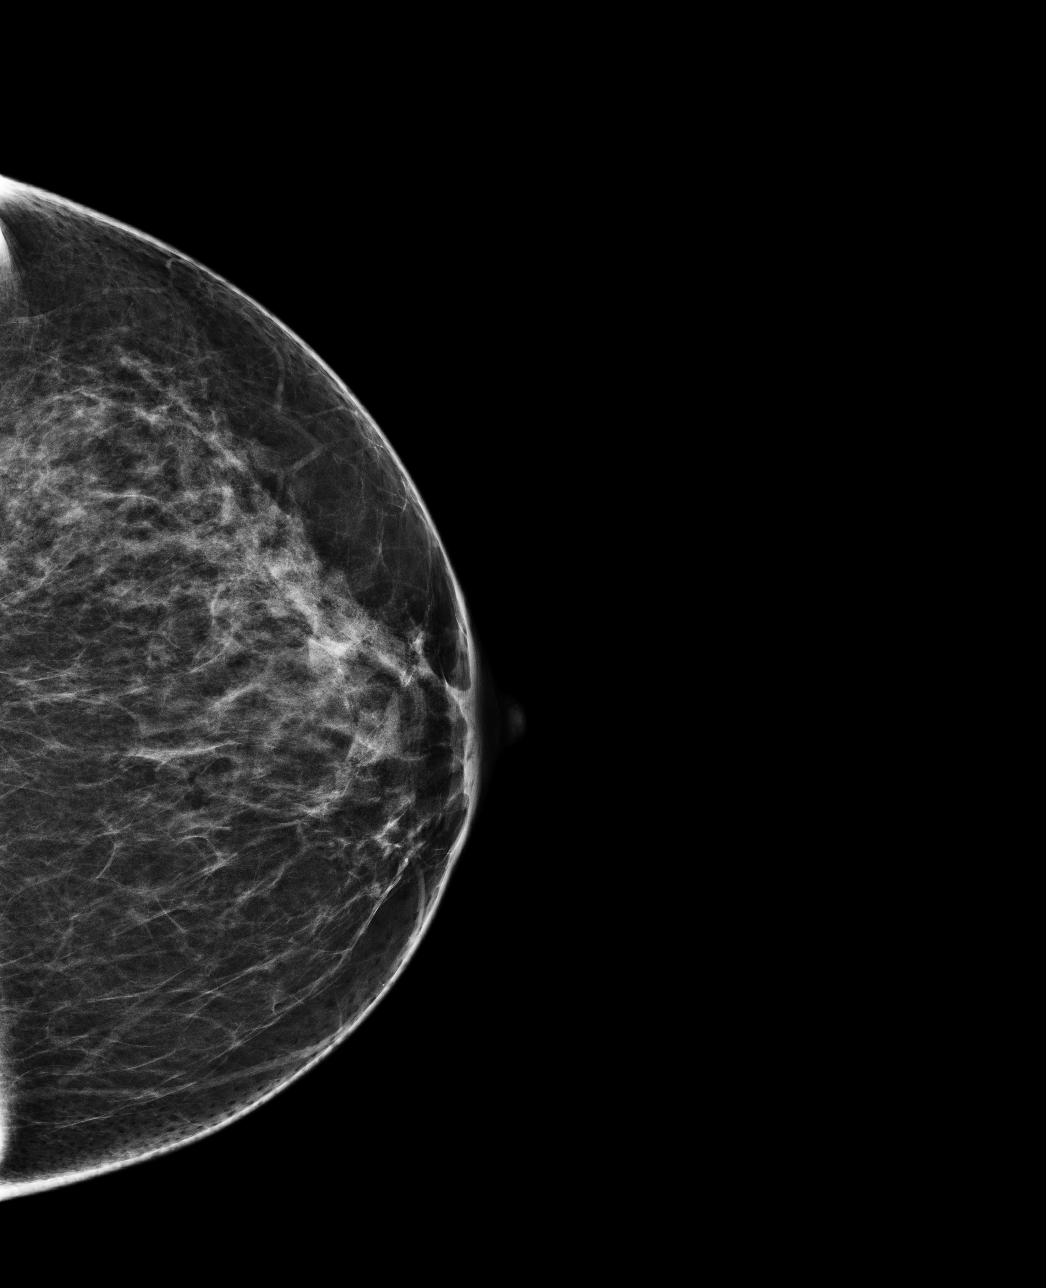

[R MLO]
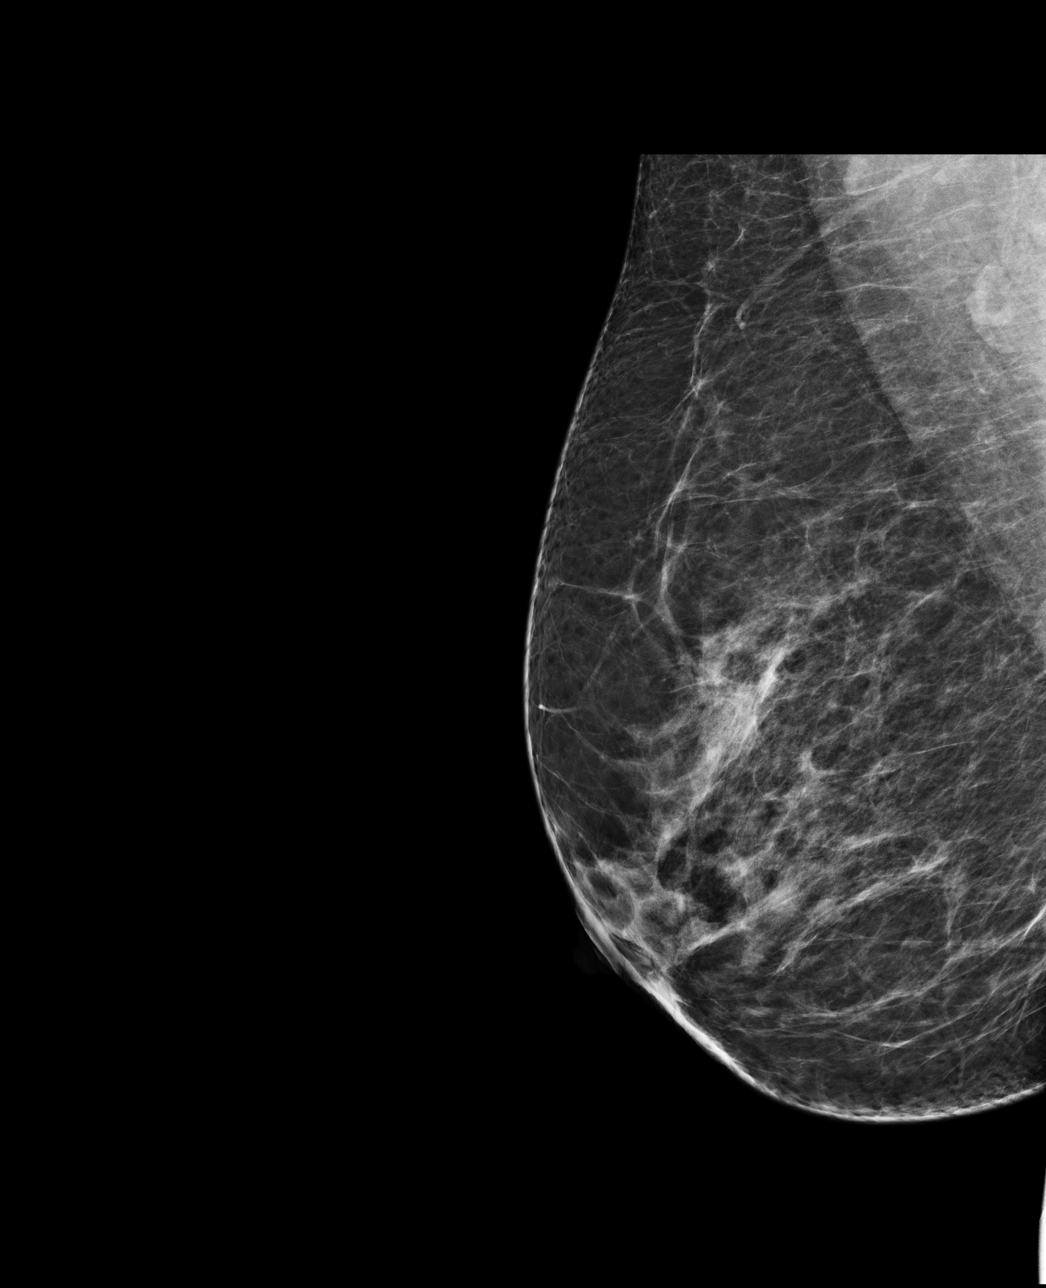

[4 of 4 positions shown; findings below may reference images not displayed]

ACR Breast Density Category b: There are scattered areas of
fibroglandular density.
FINDINGS: There are no findings suspicious for malignancy. Images were
processed with CAD.
IMPRESSION: No mammographic evidence of malignancy. A result letter of this
screening mammogram will be mailed directly to the patient.

RECOMMENDATION:
Screening mammogram in one year. (Code:AS-G-LCT)

BI-RADS CATEGORY  1: Negative.

## 2018-04-24 ENCOUNTER — Other Ambulatory Visit: Payer: Self-pay

## 2018-04-24 ENCOUNTER — Ambulatory Visit: Payer: BLUE CROSS/BLUE SHIELD | Admitting: Obstetrics and Gynecology

## 2018-04-24 ENCOUNTER — Encounter: Payer: Self-pay | Admitting: Obstetrics and Gynecology

## 2018-04-24 VITALS — BP 130/76 | HR 80 | Resp 14 | Ht 65.0 in | Wt 214.0 lb

## 2018-04-24 DIAGNOSIS — Z01419 Encounter for gynecological examination (general) (routine) without abnormal findings: Secondary | ICD-10-CM

## 2018-04-24 DIAGNOSIS — N920 Excessive and frequent menstruation with regular cycle: Secondary | ICD-10-CM | POA: Diagnosis not present

## 2018-04-24 DIAGNOSIS — R3915 Urgency of urination: Secondary | ICD-10-CM

## 2018-04-24 LAB — POCT URINALYSIS DIPSTICK
Bilirubin, UA: NEGATIVE
GLUCOSE UA: NEGATIVE
Ketones, UA: NEGATIVE
Nitrite, UA: NEGATIVE
Protein, UA: NEGATIVE
Urobilinogen, UA: 0.2 E.U./dL
pH, UA: 5 (ref 5.0–8.0)

## 2018-04-24 MED ORDER — NORETHINDRONE 0.35 MG PO TABS: 1.0000 | ORAL_TABLET | Freq: Every day | ORAL | 0 refills | Status: DC

## 2018-04-24 MED ORDER — TOLTERODINE TARTRATE ER 4 MG PO CP24: 4.0000 mg | ORAL_CAPSULE | Freq: Every day | ORAL | 1 refills | Status: DC

## 2018-04-24 NOTE — Patient Instructions (Signed)

## 2018-04-24 NOTE — Progress Notes (Signed)
53 y.o. G82P2002 Married Caucasian female here for annual exam.    Still with urinary urgency.  Tired Ditropan XL and this spaced out her bathroom visits.  Leaking less at night.  Myrbetriq was not approved, so she did not try this.  States she is a soda drinker all day long, 16 ounce Coke.   States she thinks her A1C is about 5.6.  Wants her urine checked.  Denies dysuria.  States she has UTIs and does not have symptoms.   On Micronor. Still having menses monthly. Can have menses twice a month, occurred 3 times last year.  No missed pills.  Usually her flow is lasting 7 days.  At heaviest, changes her pad every 3 - 4 hours.  No significant pain.   PCP: Dr. Arelia Sneddon    Patient's last menstrual period was 04/11/2018.           Sexually active: Yes.    The current method of family planning is Micronor.    Exercising: Yes.    walking Smoker:  no  Health Maintenance: Pap:  04/05/17 Pap and HR HPV negative History of abnormal Pap:  no MMG:  04/17/18 BIRADS 1 negative/density c Colonoscopy:  2019 patient did stool kit, per patient negative BMD: about 2006 per patient    Result  normal TDaP:  2013 Gardasil:   n/a HIV: 1996 with pregnancy  Hep C: negative in the past Screening Labs:  PCP   reports that she has never smoked. She has never used smokeless tobacco. She reports that she does not drink alcohol or use drugs.  Past Medical History:  Diagnosis Date  . DDD (degenerative disc disease), lumbar 11/2008  . Diabetes mellitus 03/2010   Dr. Chalmers Cater  . Fatty liver 12/11   bx revealed--granuloma  . Hyperlipidemia 07/2010  . Hypertension   . Polymyalgia ([place])   . Vitamin D deficiency disease 2009    Past Surgical History:  Procedure Laterality Date  . CESAREAN SECTION  10/89 & 5/96  . LUMBAR SPINE SURGERY  11/19/2012   L4-L5 fusion  . WISDOM TOOTH EXTRACTION  age 44    Current Outpatient Medications  Medication Sig Dispense Refill  . calcium carbonate (OS-CAL) 600 MG  TABS Take 600 mg by mouth 2 (two) times daily with a meal.    . Cyanocobalamin (B-12) 5000 MCG CAPS Take by mouth daily.    Marland Kitchen lisinopril-hydrochlorothiazide (PRINZIDE,ZESTORETIC) 20-12.5 MG tablet Take 2 tablets by mouth daily.     . meloxicam (MOBIC) 15 MG tablet Take 15 mg by mouth daily.    . metFORMIN (GLUCOPHAGE) 500 MG tablet Take 500 mg by mouth daily before lunch.     . Multiple Vitamin (MULTIVITAMIN) tablet Take 1 tablet by mouth daily.    . norethindrone (MICRONOR,CAMILA,ERRIN) 0.35 MG tablet Take 1 tablet (0.35 mg total) by mouth daily. 1 Package 0  . Omega-3 Fatty Acids (FISH OIL) 1200 MG CAPS Take 1 capsule by mouth 3 (three) times daily.    . phentermine 15 MG capsule Take 15 mg by mouth every morning.    . potassium chloride SA (K-DUR,KLOR-CON) 20 MEQ tablet Take 20 mEq by mouth 2 (two) times daily.   0  . traMADol (ULTRAM) 50 MG tablet   0   No current facility-administered medications for this visit.     Family History  Problem Relation Age of Onset  . Thyroid disease Mother   . Hypertension Mother   . Diabetes Maternal Grandmother   . Coronary  artery disease Maternal Grandmother   . Coronary artery disease Father   . Hypertension Sister   . Coronary artery disease Maternal Grandfather   . Rheum arthritis Paternal Grandmother     Review of Systems  Constitutional: Negative.   HENT: Negative.   Eyes: Negative.   Respiratory: Negative.   Cardiovascular: Negative.   Gastrointestinal: Negative.   Endocrine: Negative.   Genitourinary: Positive for urgency.       Loss of urine spontaneously Loss of urine with sneeze or cough  Musculoskeletal: Negative.   Skin: Negative.   Allergic/Immunologic: Negative.   Neurological: Negative.   Hematological: Negative.   Psychiatric/Behavioral: Negative.     Exam:   BP 130/76 (BP Location: Right Arm, Patient Position: Sitting, Cuff Size: Large)   Pulse 80   Resp 14   Ht '5\' 5"'$  (1.651 m)   Wt 214 lb (97.1 kg)   LMP  04/11/2018   BMI 35.61 kg/m     General appearance: alert, cooperative and appears stated age Head: Normocephalic, without obvious abnormality, atraumatic Neck: no adenopathy, supple, symmetrical, trachea midline and thyroid enlarged with left thyroid nodule. Lungs: clear to auscultation bilaterally Breasts: normal appearance, no masses or tenderness, No nipple retraction or dimpling, No nipple discharge or bleeding, No axillary or supraclavicular adenopathy Heart: regular rate and rhythm Abdomen: soft, non-tender; no masses, no organomegaly Extremities: extremities normal, atraumatic, no cyanosis or edema Skin: Skin color, texture, turgor normal. No rashes or lesions Lymph nodes: Cervical, supraclavicular, and axillary nodes normal. No abnormal inguinal nodes palpated Neurologic: Grossly normal  Pelvic: External genitalia:  no lesions              Urethra:  normal appearing urethra with no masses, tenderness or lesions              Bartholins and Skenes: normal                 Vagina: normal appearing vagina with normal color and discharge, no lesions              Cervix: no lesions.  Yellow discharge from cervix.               Pap taken: No. Bimanual Exam:  Uterus:  normal size, contour, position, consistency, mobility, non-tender              Adnexa: no mass, fullness, tenderness              Rectal exam: Yes.  .  Confirms.              Anus:  normal sphincter tone, no lesions  Chaperone was present for exam.  Assessment:   Well woman visit with normal exam. Overactive bladder.  Stress incontinence.  HTN.  Well controlled.  On Micronor. Enlarged thyroid with left thyroid nodule.  Endocrinology managing.  Recurrent frequent menses.  Abnormal urine dip.   Plan: Mammogram screening. Recommended self breast awareness. Pap and HR HPV as above. Guidelines for Calcium, Vitamin D, regular exercise program including cardiovascular and weight bearing exercise. Return for  sonohysterogram and EMB.  Rational explained.  Will refill Micronor for 3 months so she does not run out.  Detrol LA 4 mg daily.  #30, RF 1.  We reviewed bladder irritants. Urine micro and culture.  Follow up annually and prn.   After visit summary provided.

## 2018-04-25 LAB — URINALYSIS, MICROSCOPIC ONLY: Epithelial Cells (non renal): >10 /hpf — AB (ref 0–10)

## 2018-04-26 LAB — URINE CULTURE

## 2018-04-29 ENCOUNTER — Telehealth: Payer: Self-pay | Admitting: Obstetrics and Gynecology

## 2018-04-29 ENCOUNTER — Encounter: Payer: Self-pay | Admitting: Obstetrics and Gynecology

## 2018-04-29 NOTE — Telephone Encounter (Signed)
Call placed to patient to convey benefits and schedule sonohysterogram and endometrial biopsy. Left voicemail message requesting a return call

## 2018-04-30 NOTE — Telephone Encounter (Signed)
Second call placed to convey benefits and scheduled sonohysterogram and endometrial biopsy. Left voicemail message requesting a return call

## 2018-04-30 NOTE — Telephone Encounter (Signed)
Patient returned called. Spoke with patient regarding benefit for sonohysterogram and endometrial biopsy. Patient understood and agreeable. Patient ready to schedule. Patient scheduled 05/22/2018 with Dr Quincy Simmonds. Patient declined earlier appointment dates offer, patient advises she will be helping her son move next week and the week of 05/12/2018, she will be out of town that week. Patient aware of appointment date, arrival time and cancellation policy. No further questions.   Forwarding to Dr Quincy Simmonds for final review. Patient is agreeable to disposition. Will close encounter

## 2018-05-19 ENCOUNTER — Telehealth: Payer: Self-pay | Admitting: *Deleted

## 2018-05-19 NOTE — Telephone Encounter (Signed)
Call to patient regarding scheduled appointment on 05-22-2018. Advised due to Covid 19, unable to proceed with non-emergent appointments. Patient states she wanted to cancel as well.Declines to reschedule at this time  Advised we are available for emergencies and to call if abnormal bleeding occurs.    Routing to Dr Quincy Simmonds. Encounter closed.

## 2018-05-22 ENCOUNTER — Other Ambulatory Visit: Payer: BLUE CROSS/BLUE SHIELD | Admitting: Obstetrics and Gynecology

## 2018-05-22 ENCOUNTER — Other Ambulatory Visit: Payer: BLUE CROSS/BLUE SHIELD

## 2018-07-18 ENCOUNTER — Other Ambulatory Visit: Payer: Self-pay | Admitting: *Deleted

## 2018-07-18 NOTE — Telephone Encounter (Signed)
Patient calling to request refills states she only has 2 pills left  Medication refill request: micronor  Last AEX:  04/24/18  Next AEX: 04/30/19 Last MMG (if hormonal medication request): 04/17/18 BIRADS1:neg  Refill authorized: 04/24/18 #3packs/0R. Today please advise.

## 2018-07-18 NOTE — Telephone Encounter (Signed)
Please contact patient to reschedule her appointment for sonohysterogram with possible EMB.   She cancelled this at the beginning of the Covid 19 pandemic.   I will refill Micronor for one month.

## 2018-07-18 NOTE — Telephone Encounter (Signed)
Spoke with patient. Patient declines to reschedule [facility] and possible EMB at this time. [facility] and possible EMB for frequent menses. Patient states she has not had any irregular menses to date this year. Patient adamant that she does not want to return for [facility] at this time, would like to wait and see if irregular bleeding reoccurs. Patient states rational for test was discussed at last OV and is aware of purpose. Patient states menses have been regular, " I may have missed my pills in the past causing them to be irregular". Advised I will update Dr. Quincy Simmonds and return call if any additional recommendations.   Dr. Quincy Simmonds - please advise, Rx pended for Micronor.

## 2018-07-22 MED ORDER — NORETHINDRONE 0.35 MG PO TABS: 1.0000 | ORAL_TABLET | Freq: Every day | ORAL | 2 refills | Status: DC

## 2018-09-03 ENCOUNTER — Telehealth: Payer: Self-pay | Admitting: *Deleted

## 2018-09-03 NOTE — Telephone Encounter (Signed)
Call to patient to follow-up on canceled pelvic ultrasound and endometrial biopsy. Previously canceled in March due to Covid 19. Patient states she has not had any vaginal bleeding and declines to schedule recommended testing.  Feels bleeding was related to missed pill and repeated several times she doesn't feel this testing is needed.  Reports had significant bleeding in 2014 and all testing was normal. Advised that all PMB warrants evaluation to rule out abnormal cells even if only occurs one time.  Patient declines to proceed. Will call back if she has any further bleeding but does not want to schedule at this time.   Routing to Dr Quincy Simmonds for review.

## 2018-09-05 NOTE — Telephone Encounter (Signed)
OK to cancel pelvic US and EMB which was ordered for abnormal uterine bleeding as her symptoms have resolved.  Fortunately, patient doe not have postmenopausal bleeding.  You may close the encounter.

## 2019-02-25 ENCOUNTER — Other Ambulatory Visit: Payer: Self-pay | Admitting: Endocrinology

## 2019-02-25 DIAGNOSIS — E049 Nontoxic goiter, unspecified: Secondary | ICD-10-CM

## 2019-03-10 ENCOUNTER — Ambulatory Visit
Admission: RE | Admit: 2019-03-10 | Discharge: 2019-03-10 | Disposition: A | Discharge status: Home / Self Care | Payer: No Typology Code available for payment source | Source: Ambulatory Visit | Attending: Endocrinology | Admitting: Endocrinology

## 2019-03-10 DIAGNOSIS — E049 Nontoxic goiter, unspecified: Secondary | ICD-10-CM

## 2019-03-23 ENCOUNTER — Other Ambulatory Visit: Payer: Self-pay

## 2019-03-23 ENCOUNTER — Other Ambulatory Visit: Payer: Self-pay | Admitting: Obstetrics and Gynecology

## 2019-03-23 NOTE — Telephone Encounter (Signed)
Patient in need of birth control refill before annual exam on 05/05/19. States she is on her last pack with only a week left.

## 2019-03-23 NOTE — Telephone Encounter (Signed)
Medication refill request: Micronor Last AEX:  04/24/18  Next AEX: 3/9.21 Last MMG (if hormonal medication request): NA Refill authorized: #3 packs with 0 Rf

## 2019-03-24 MED ORDER — NORETHINDRONE 0.35 MG PO TABS: 1.0000 | ORAL_TABLET | Freq: Every day | ORAL | 0 refills | Status: DC

## 2019-03-24 NOTE — Telephone Encounter (Signed)
I already refilled this prescription earlier today.

## 2019-03-24 NOTE — Telephone Encounter (Signed)
Spoke with patient and she is scheduled for AEX 05-05-19 and needs refill on Micronor until appoint.  Medication refill request: Micronor 1pack, R1 Last AEX: 04-24-18 Next AEX: 39-21 Last MMG (if hormonal medication request): 04-17-18 Neg/Birads1 Refill authorized: Refill if appropriate

## 2019-03-30 ENCOUNTER — Other Ambulatory Visit: Payer: Self-pay | Admitting: Obstetrics and Gynecology

## 2019-03-30 DIAGNOSIS — Z1231 Encounter for screening mammogram for malignant neoplasm of breast: Secondary | ICD-10-CM

## 2019-04-28 ENCOUNTER — Ambulatory Visit
Admission: RE | Admit: 2019-04-28 | Discharge: 2019-04-28 | Disposition: A | Discharge status: Home / Self Care | Payer: 59 | Source: Ambulatory Visit | Attending: Obstetrics and Gynecology | Admitting: Obstetrics and Gynecology

## 2019-04-28 ENCOUNTER — Other Ambulatory Visit: Payer: Self-pay

## 2019-04-28 DIAGNOSIS — Z1231 Encounter for screening mammogram for malignant neoplasm of breast: Secondary | ICD-10-CM

## 2019-04-30 ENCOUNTER — Ambulatory Visit: Payer: BLUE CROSS/BLUE SHIELD | Admitting: Obstetrics and Gynecology

## 2019-05-04 NOTE — Progress Notes (Signed)
54 y.o. G73P2002 Married Caucasian female here for annual exam.    Occasional hot flashes.  Had more 5 years ago then she does now.  Cycle every month.  Bleeding last 5 days. No cramping.  No spotting outside of her cycle time.  She is taking Micronor.   Did her lab work with Dr. Suzette Battiest.  Brings in a copy.   She has some urinary odor in the toilet.  No pain with urination.  She did not fill the Detrol.  Sometimes she need to double void.  Sometimes having urgency but not consistently.  Can go all night without voiding.  She is drinking some caffeine sodas and caffeine tea in the am.  She also takes a diuretic also.   She was told she may have psoriatic arthritis.   Urine Dip: Trace WBCS, Trace RBCs  PCP:   Claris Gower, MD  Patient's last menstrual period was 04/29/2019 (exact date).           Sexually active: Yes.    The current method of family planning is oral progesterone-only contraceptive.    Exercising: Yes.    walking and keeping 23-year-old granddaughter Smoker:  no  Health Maintenance: Pap: 04-05-17 Neg:Neg HR HPV, 03-01-14 Neg:Neg HR HPV, 02-11-12 Neg:Neg HR HPV History of abnormal Pap:  no MMG:  04-28-19 3D/Neg/density C/BiRads1 Colonoscopy:  Never--did home IFOB 1-2 years ago and negative.  She did with her PCP.  BMD: 2006 per patient  Result :Normal TDaP:  2013 Gardasil:   no HIV: Neg with pregnancy Hep C: neg in the past Screening Labs:  PCP or endocrinology.    reports that she has never smoked. She has never used smokeless tobacco. She reports that she does not drink alcohol or use drugs.  Past Medical History:  Diagnosis Date  . DDD (degenerative disc disease), lumbar 11/2008  . Diabetes mellitus 03/2010   Dr. Chalmers Cater  . Elevated alkaline phosphatase level   . Fatty liver 12/11   bx revealed--granuloma  . Hyperlipidemia 07/2010  . Hypertension   . Hypertriglyceridemia   . Polymyalgia ([place])   . Vitamin D deficiency disease 2009    Past Surgical  History:  Procedure Laterality Date  . CESAREAN SECTION  10/89 & 5/96  . LUMBAR SPINE SURGERY  11/19/2012   L4-L5 fusion  . WISDOM TOOTH EXTRACTION  age 31    Current Outpatient Medications  Medication Sig Dispense Refill  . calcium carbonate (OS-CAL) 600 MG TABS Take 600 mg by mouth 2 (two) times daily with a meal.    . Cyanocobalamin (B-12) 5000 MCG CAPS Take by mouth daily.    Marland Kitchen lisinopril-hydrochlorothiazide (PRINZIDE,ZESTORETIC) 20-12.5 MG tablet Take 2 tablets by mouth daily.     . meloxicam (MOBIC) 15 MG tablet Take 15 mg by mouth daily.    . metFORMIN (GLUCOPHAGE-XR) 500 MG 24 hr tablet Take 500 mg by mouth daily.    . Multiple Vitamin (MULTIVITAMIN) tablet Take 1 tablet by mouth daily.    . norethindrone (MICRONOR) 0.35 MG tablet Take 1 tablet (0.35 mg total) by mouth daily. 3 Package 0  . Omega-3 Fatty Acids (FISH OIL) 1200 MG CAPS Take 1 capsule by mouth 3 (three) times daily.    . phentermine 15 MG capsule Take 15 mg by mouth every morning.    . potassium chloride SA (K-DUR,KLOR-CON) 20 MEQ tablet Take 20 mEq by mouth 2 (two) times daily.   0  . traMADol (ULTRAM) 50 MG tablet   0  No current facility-administered medications for this visit.    Family History  Problem Relation Age of Onset  . Thyroid disease Mother   . Hypertension Mother   . Diabetes Maternal Grandmother   . Coronary artery disease Maternal Grandmother   . Coronary artery disease Father   . Hypertension Sister   . Coronary artery disease Maternal Grandfather   . Rheum arthritis Paternal Grandmother     Review of Systems  Genitourinary:       Odorous urine  All other systems reviewed and are negative.   Exam:   BP (!) 160/82 (Cuff Size: Large)   Pulse 80   Temp (!) 97 F (36.1 C) (Temporal)   Resp 16   Ht 5' 5.5" (1.664 m)   Wt 198 lb 9.6 oz (90.1 kg)   LMP 04/29/2019 (Exact Date)   BMI 32.55 kg/m     General appearance: alert, cooperative and appears stated age Head: normocephalic,  without obvious abnormality, atraumatic Neck: no adenopathy, supple, symmetrical, trachea midline and thyroid slightly enlarged.  Lungs: clear to auscultation bilaterally Breasts: normal appearance, no masses or tenderness, No nipple retraction or dimpling, No nipple discharge or bleeding, No axillary adenopathy Heart: regular rate and rhythm Abdomen: soft, non-tender; no masses, no organomegaly Extremities: extremities normal, atraumatic, no cyanosis or edema Skin: skin color, texture, turgor normal. Excoriated skin lesions of arms, buttocks, legs.  Lymph nodes: cervical, supraclavicular, and axillary nodes normal. Neurologic: grossly normal  Pelvic: External genitalia:  no lesions              No abnormal inguinal nodes palpated.              Urethra:  normal appearing urethra with no masses, tenderness or lesions              Bartholins and Skenes: normal                 Vagina: normal appearing vagina with normal color and discharge, no lesions              Cervix: no lesions              Pap taken: No. Bimanual Exam:  Uterus:  normal size, contour, position, consistency, mobility, non-tender              Adnexa: no mass, fullness, tenderness              Rectal exam: Yes.  .  Confirms.              Anus:  normal sphincter tone, no lesions  Chaperone was present for exam.  Assessment:   Well woman visit with normal exam. Overactive bladder.  Stress incontinence.  HTN.   On Micronor. Enlarged thyroid with left thyroid nodule.  Endocrinology managing.  Recurrent frequent menses.  Abnormal urine dip.  Skin rash.   Plan: Mammogram screening discussed. Self breast awareness reviewed. Pap and HR HPV 2024.  Guidelines for Calcium, Vitamin D, regular exercise program including cardiovascular and weight bearing exercise. Refill of Micronor for one year.  She will call for irregular vaginal bleeding.  She understands this may be a sign of a gynecologic problem if it occurs and  she is not missing any pills.  Check urine micro and culture.  We discussed bladder irritants.  No medication at this time for OAB. IFOB.  FU with PCP for BP check.  She will follow up with dermatology.  Follow up annually and prn.  After visit summary provided.

## 2019-05-05 ENCOUNTER — Other Ambulatory Visit: Payer: Self-pay

## 2019-05-05 ENCOUNTER — Ambulatory Visit (INDEPENDENT_AMBULATORY_CARE_PROVIDER_SITE_OTHER): Payer: 59 | Admitting: Obstetrics and Gynecology

## 2019-05-05 ENCOUNTER — Encounter: Payer: Self-pay | Admitting: Obstetrics and Gynecology

## 2019-05-05 VITALS — BP 160/82 | HR 80 | Temp 97.0°F | Resp 16 | Ht 65.5 in | Wt 198.6 lb

## 2019-05-05 DIAGNOSIS — Z01419 Encounter for gynecological examination (general) (routine) without abnormal findings: Secondary | ICD-10-CM

## 2019-05-05 DIAGNOSIS — R8290 Unspecified abnormal findings in urine: Secondary | ICD-10-CM

## 2019-05-05 DIAGNOSIS — Z1211 Encounter for screening for malignant neoplasm of colon: Secondary | ICD-10-CM

## 2019-05-05 LAB — POCT URINALYSIS DIPSTICK
Bilirubin, UA: NEGATIVE
Glucose, UA: NEGATIVE
Ketones, UA: NEGATIVE
Nitrite, UA: NEGATIVE
Protein, UA: NEGATIVE
Urobilinogen, UA: 0.2 E.U./dL
pH, UA: 5 (ref 5.0–8.0)

## 2019-05-05 MED ORDER — NORETHINDRONE 0.35 MG PO TABS: 1.0000 | ORAL_TABLET | Freq: Every day | ORAL | 3 refills | Status: DC

## 2019-05-05 NOTE — Patient Instructions (Signed)

## 2019-05-06 LAB — URINALYSIS, MICROSCOPIC ONLY
Casts: NONE SEEN /lpf
WBC, UA: NONE SEEN /hpf (ref 0–5)

## 2019-05-07 LAB — URINE CULTURE

## 2019-05-13 LAB — FECAL OCCULT BLOOD, IMMUNOCHEMICAL: Fecal Occult Bld: POSITIVE — AB

## 2019-05-14 ENCOUNTER — Other Ambulatory Visit: Payer: Self-pay | Admitting: *Deleted

## 2019-05-14 DIAGNOSIS — R195 Other fecal abnormalities: Secondary | ICD-10-CM

## 2019-06-09 ENCOUNTER — Encounter: Payer: Self-pay | Admitting: Obstetrics and Gynecology

## 2020-05-06 ENCOUNTER — Other Ambulatory Visit: Payer: Self-pay | Admitting: Obstetrics and Gynecology

## 2020-05-06 DIAGNOSIS — Z1231 Encounter for screening mammogram for malignant neoplasm of breast: Secondary | ICD-10-CM

## 2020-05-09 NOTE — Progress Notes (Signed)
55 y.o. G28P2002 Married Caucasian female here for annual exam.    Menstrual cycles are becoming irregular--happening about every other month with POP. She has missed 4 menses in the last year.  She skipped August and September menses.   Having some hot flashes. Her first day of her cyce is the most heavy, and by day three it is mild bleeding. No cramping.   Lucas daughter is now 26 yo and she cares for her.   PCP: Claris Gower, MD Endocrine:  Dr. Chalmers Cater.    Patient's last menstrual period was 04/30/2020 (exact date).           Sexually active: Yes.    The current method of family planning is oral progesterone-only contraceptive.    Exercising: Yes.    walks and takes care of 30 year old granddaughter Smoker:  no  Health Maintenance: Pap:  04-05-17 Neg:Neg HR HPV, 03-01-14 Neg:Neg HR HPV, 02-11-12 Neg:Neg HR HPV History of abnormal Pap:  no MMG:  04-28-19 3D/Neg/BiRads1--appt.07/01/20 Colonoscopy:  06-09-19 normal;next 10 years BMD:  2006  Result :Normal TDaP:  2013 Gardasil:   no HIV: Neg with preg Hep C: Neg in the past Screening Labs:  PCP and endocrinology.    reports that she has never smoked. She has never used smokeless tobacco. She reports that she does not drink alcohol and does not use drugs.  Past Medical History:  Diagnosis Date  . DDD (degenerative disc disease), lumbar 11/2008  . Diabetes mellitus 03/2010   Dr. Chalmers Cater  . Elevated alkaline phosphatase level   . Fatty liver 12/11   bx revealed--granuloma  . Hyperlipidemia 07/2010  . Hypertension   . Hypertriglyceridemia   . Polymyalgia ([place])   . Vitamin D deficiency disease 2009    Past Surgical History:  Procedure Laterality Date  . CESAREAN SECTION  10/89 & 5/96  . LUMBAR SPINE SURGERY  11/19/2012   L4-L5 fusion  . WISDOM TOOTH EXTRACTION  age 17    Current Outpatient Medications  Medication Sig Dispense Refill  . Biotin 800 MCG TABS 1 tablet    . calcium carbonate (OS-CAL) 600 MG TABS Take 600 mg by mouth  2 (two) times daily with a meal.    . Cyanocobalamin (B-12) 5000 MCG CAPS Take by mouth daily.    Marland Kitchen lisinopril-hydrochlorothiazide (PRINZIDE,ZESTORETIC) 20-12.5 MG tablet Take 2 tablets by mouth daily.     . meloxicam (MOBIC) 15 MG tablet Take 15 mg by mouth daily.    . metFORMIN (GLUCOPHAGE-XR) 500 MG 24 hr tablet Take 500 mg by mouth daily.    . Multiple Vitamin (MULTIVITAMIN) tablet Take 1 tablet by mouth daily.    . norethindrone (MICRONOR) 0.35 MG tablet Take 1 tablet (0.35 mg total) by mouth daily. 3 Package 3  . Omega-3 Fatty Acids (FISH OIL) 1200 MG CAPS Take 1 capsule by mouth 3 (three) times daily.    . potassium chloride SA (KLOR-CON) 20 MEQ tablet Take 1 tablet by mouth 2 (two) times daily.    . traMADol (ULTRAM) 50 MG tablet 1 tablet as needed    . Vitamin D, Ergocalciferol, 50 MCG (2000 UT) CAPS 1 capsule     No current facility-administered medications for this visit.    Family History  Problem Relation Age of Onset  . Thyroid disease Mother   . Hypertension Mother   . Diabetes Maternal Grandmother   . Coronary artery disease Maternal Grandmother   . Coronary artery disease Father   . Hypertension Sister   .  Coronary artery disease Maternal Grandfather   . Rheum arthritis Paternal Grandmother     Review of Systems  Genitourinary: Positive for dysuria.  All other systems reviewed and are negative.   Exam:   BP (!) 152/84 (Cuff Size: Large)   Pulse 87   Ht 5\' 5"  (1.651 m)   Wt 195 lb (88.5 kg)   LMP 04/30/2020 (Exact Date)   SpO2 98%   BMI 32.45 kg/m     General appearance: alert, cooperative and appears stated age Head: normocephalic, without obvious abnormality, atraumatic Neck: no adenopathy, supple, symmetrical, trachea midline and thyroid normal to inspection and palpation Lungs: clear to auscultation bilaterally Breasts: normal appearance, no masses or tenderness, No nipple retraction or dimpling, No nipple discharge or bleeding, No axillary  adenopathy Heart: regular rate and rhythm Abdomen: soft, non-tender; no masses, no organomegaly Extremities: extremities normal, atraumatic, no cyanosis or edema Skin: skin color, texture, turgor normal. No rashes or lesions Lymph nodes: cervical, supraclavicular, and axillary nodes normal. Neurologic: grossly normal  Pelvic: External genitalia:  no lesions              No abnormal inguinal nodes palpated.              Urethra:  normal appearing urethra with no masses, tenderness or lesions              Bartholins and Skenes: normal                 Vagina: normal appearing vagina with normal color and discharge, no lesions              Cervix: no lesions              Pap taken: No. Bimanual Exam:  Uterus:  normal size, contour, position, consistency, mobility, non-tender              Adnexa: no mass, fullness, tenderness              Rectal exam: Yes.  .  Confirms.              Anus:  normal sphincter tone, no lesions  Chaperone was present for exam.  Assessment:   Well woman visit with normal exam. Hx mixed incontinence.  HTN.   On Micronor. Enlarged thyroid with left thyroid nodule.Endocrinology managing. Dysuria.   Plan: Mammogram screening discussed. Self breast awareness reviewed. Pap and HR HPV as above. Guidelines for Calcium, Vitamin D, regular exercise program including cardiovascular and weight bearing exercise. Urinalysis:  sg 1.025, 0 - 5 WBC, 0 - 2 RBC, 6 - 10 squams, few bacteria, few mucous.  UC sent.  No abx at this time. Refill of Micronor for one year.  Will have her stop her birth control pills 2 weeks prior to her next annual exam and will check [place] and estradiol level then.  Follow up annually and prn.

## 2020-05-10 ENCOUNTER — Other Ambulatory Visit: Payer: Self-pay

## 2020-05-10 ENCOUNTER — Encounter: Payer: Self-pay | Admitting: Obstetrics and Gynecology

## 2020-05-10 ENCOUNTER — Ambulatory Visit (INDEPENDENT_AMBULATORY_CARE_PROVIDER_SITE_OTHER): Payer: 59 | Admitting: Obstetrics and Gynecology

## 2020-05-10 VITALS — BP 152/84 | HR 87 | Ht 65.0 in | Wt 195.0 lb

## 2020-05-10 DIAGNOSIS — Z01419 Encounter for gynecological examination (general) (routine) without abnormal findings: Secondary | ICD-10-CM | POA: Diagnosis not present

## 2020-05-10 DIAGNOSIS — R300 Dysuria: Secondary | ICD-10-CM | POA: Diagnosis not present

## 2020-05-10 MED ORDER — NORETHINDRONE 0.35 MG PO TABS: 1.0000 | ORAL_TABLET | Freq: Every day | ORAL | 3 refills | Status: DC

## 2020-05-10 NOTE — Patient Instructions (Signed)

## 2020-05-12 LAB — URINE CULTURE
MICRO NUMBER:: 11648939
SPECIMEN QUALITY:: ADEQUATE

## 2020-05-12 LAB — CULTURE INDICATED

## 2020-05-12 LAB — URINALYSIS, COMPLETE W/RFL CULTURE
Bilirubin Urine: NEGATIVE
Glucose, UA: NEGATIVE
Hyaline Cast: NONE SEEN /LPF
Ketones, ur: NEGATIVE
Leukocyte Esterase: NEGATIVE
Nitrites, Initial: NEGATIVE
Protein, ur: NEGATIVE
Specific Gravity, Urine: 1.025 (ref 1.001–1.03)
pH: 5.5 (ref 5.0–8.0)

## 2020-07-01 ENCOUNTER — Ambulatory Visit: Payer: 59

## 2020-08-19 ENCOUNTER — Ambulatory Visit
Admission: RE | Admit: 2020-08-19 | Discharge: 2020-08-19 | Disposition: A | Discharge status: Home / Self Care | Payer: 59 | Source: Ambulatory Visit | Attending: Obstetrics and Gynecology | Admitting: Obstetrics and Gynecology

## 2020-08-19 ENCOUNTER — Other Ambulatory Visit: Payer: Self-pay

## 2020-08-19 DIAGNOSIS — Z1231 Encounter for screening mammogram for malignant neoplasm of breast: Secondary | ICD-10-CM

## 2021-04-19 ENCOUNTER — Other Ambulatory Visit: Payer: Self-pay | Admitting: *Deleted

## 2021-04-19 NOTE — Telephone Encounter (Signed)
Patient called requesting refill on Micronor 0.35 mg tablet.  Annual exam scheduled on 06/13/21 last annual exam was 05/10/20 Last mammogram 07/2020

## 2021-04-20 MED ORDER — NORETHINDRONE 0.35 MG PO TABS: 1.0000 | ORAL_TABLET | Freq: Every day | ORAL | 0 refills | Status: DC

## 2021-05-17 ENCOUNTER — Other Ambulatory Visit: Payer: Self-pay | Admitting: Obstetrics and Gynecology

## 2021-05-17 ENCOUNTER — Other Ambulatory Visit: Payer: Self-pay | Admitting: *Deleted

## 2021-05-17 MED ORDER — NORETHINDRONE 0.35 MG PO TABS: 1.0000 | ORAL_TABLET | Freq: Every day | ORAL | 0 refills | Status: DC

## 2021-05-17 NOTE — Telephone Encounter (Signed)
Patient called requesting refill on Micronor 0.35 mg tablet, annual exam scheduled on 06/13/21. Patient will run out of pills this weekend only received 1 pack from pharmacy. I did remind patient you wanted her to stop pills 2 weeks prior to annual exam to check [facility] and estradiol level. Patient said she is worried about getting labs checked at visit due to new insurance reports the new insurance has not been paying for a lot of things.   ?

## 2021-05-17 NOTE — Progress Notes (Signed)
Micronor refill for one month.  ?

## 2021-05-17 NOTE — Telephone Encounter (Signed)
If patient is concerned about cost of blood work, we can just have her stop her birth control pills completely and observe for any future bleeding.  ? ?If she has any future bleeding off the pills, we can do blood work then.  ?

## 2021-05-17 NOTE — Telephone Encounter (Signed)
Patient informed with below, patient said she doesn't want to stop the pills. She will have the below blood done at visit and pay the bill. She asked if you would please refill, she will stop pills 2 week prior to visit. Patient said she is currently having monthly cycles.  ?

## 2021-05-17 NOTE — Telephone Encounter (Signed)
I refilled the Micronor for one month.  ?Thank you.  ?

## 2021-05-18 NOTE — Telephone Encounter (Signed)
Patient aware, Rx refilled.  ?

## 2021-06-12 NOTE — Progress Notes (Signed)
56 y.o. G33P2002 Married Caucasian female here for annual exam.   ? ?Patient stopped POP 2 weeks ago in order to have labs today. ?A few hot flashes and does feel hot at night.  ? ?Urinary odor and some dark urine.  ?Denies dysuria.  ? ?Some vaginal discharge. ? ?PCP:  Claris Gower, MD  ? ?Patient's last menstrual period was 05/23/2021 (approximate).     ?Period Cycle (Days): 30 ?Period Duration (Days): 4 ?Period Pattern: Regular ?Menstrual Flow: Moderate (moderated x2 days then tapers off) ?Menstrual Control: Maxi pad ?Dysmenorrhea: None ?    ?Sexually active: Yes.    ?The current method of family planning is oral progesterone-only contraceptive.    ?Exercising: Yes.     Walking and taking care of 3 yr.old granddaughter. ?Smoker:  no ? ?Health Maintenance: ?Pap:  04-05-17 Neg:Neg HR HPV, 03-01-14 Neg:Neg HR HPV, 02-11-12 Neg:Neg HR HPV ?History of abnormal Pap:  no ?MMG:  08-19-20 Neg/BiRads1 ?Colonoscopy:   06-09-19 normal;next 10 years ?BMD: 2006  Result :Normal ?TDaP: PCP ?Gardasil:   n/a ?HIV: Neg in Preg ?Hep C:Unsure ?Screening Labs:  PCP ? ? reports that she has never smoked. She has never used smokeless tobacco. She reports that she does not drink alcohol and does not use drugs. ? ?Past Medical History:  ?Diagnosis Date  ? DDD (degenerative disc disease), lumbar 11/2008  ? Diabetes mellitus 03/2010  ? Dr. Chalmers Cater  ? Elevated alkaline phosphatase level   ? Fatty liver 12/11  ? bx revealed--granuloma  ? Hyperlipidemia 07/2010  ? Hypertension   ? Hypertriglyceridemia   ? Polymyalgia ([place])   ? Vitamin D deficiency disease 2009  ? ? ?Past Surgical History:  ?Procedure Laterality Date  ? CESAREAN SECTION  10/89 & 5/96  ? LUMBAR SPINE SURGERY  11/19/2012  ? L4-L5 fusion  ? WISDOM TOOTH EXTRACTION  age 71  ? ? ?Current Outpatient Medications  ?Medication Sig Dispense Refill  ? Biotin 800 MCG TABS 1 tablet    ? calcium carbonate (OS-CAL) 600 MG TABS Take 600 mg by mouth 2 (two) times daily with a meal.    ? Cyanocobalamin  (B-12) 5000 MCG CAPS Take by mouth daily.    ? lisinopril-hydrochlorothiazide (PRINZIDE,ZESTORETIC) 20-12.5 MG tablet Take 2 tablets by mouth daily.     ? meloxicam (MOBIC) 15 MG tablet Take 15 mg by mouth daily.    ? Multiple Vitamin (MULTIVITAMIN) tablet Take 1 tablet by mouth daily.    ? norethindrone (MICRONOR) 0.35 MG tablet Take 1 tablet (0.35 mg total) by mouth daily. Stop taking pills 2 weeks prior to appointment. 28 tablet 0  ? Omega-3 Fatty Acids (FISH OIL) 1200 MG CAPS Take 1 capsule by mouth 3 (three) times daily.    ? potassium chloride SA (KLOR-CON) 20 MEQ tablet Take 1 tablet by mouth 2 (two) times daily.    ? rosuvastatin (CRESTOR) 5 MG tablet 1 tablet    ? thiamine 100 MG tablet 1 tablet    ? traMADol (ULTRAM) 50 MG tablet 1 tablet as needed    ? Vitamin D, Ergocalciferol, 50 MCG (2000 UT) CAPS 1 capsule    ? ?No current facility-administered medications for this visit.  ? ? ?Family History  ?Problem Relation Age of Onset  ? Thyroid disease Mother   ? Hypertension Mother   ? Diabetes Maternal Grandmother   ? Coronary artery disease Maternal Grandmother   ? Coronary artery disease Father   ? Hypertension Sister   ? Coronary artery disease Maternal  Grandfather   ? Rheum arthritis Paternal Grandmother   ? ? ?Review of Systems  ?Genitourinary:   ?     Abnormal urine odor  ?All other systems reviewed and are negative. ? ?Exam:   ?BP (!) 148/88   Pulse 87   Ht 5' 4.5" (1.638 m)   Wt 200 lb (90.7 kg)   LMP 05/23/2021 (Approximate)   SpO2 98%   BMI 33.80 kg/m?     ?General appearance: alert, cooperative and appears stated age ?Head: normocephalic, without obvious abnormality, atraumatic ?Neck: no adenopathy, supple, symmetrical, trachea midline and thyroid =with fullness on left.  ?Lungs: clear to auscultation bilaterally ?Breasts: normal appearance, no masses or tenderness, No nipple retraction or dimpling, No nipple discharge or bleeding, No axillary adenopathy ?Heart: regular rate and  rhythm ?Abdomen: soft, non-tender; no masses, no organomegaly ?Extremities: extremities normal, atraumatic, no cyanosis or edema ?Skin: skin color, texture, turgor normal. No rashes or lesions ?Lymph nodes: cervical, supraclavicular, and axillary nodes normal. ?Neurologic: grossly normal ? ?Pelvic: External genitalia:  no lesions ?             No abnormal inguinal nodes palpated. ?             Urethra:  normal appearing urethra with no masses, tenderness or lesions ?             Bartholins and Skenes: normal    ?             Vagina: normal appearing vagina with normal color and clumpy yellow green discharge. ?             Cervix: no lesions ?             Pap taken: no ?Bimanual Exam:  Uterus:  normal size, contour, position, consistency, mobility, non-tender ?             Adnexa: no mass, fullness, tenderness ?             Rectal exam: yes.  Confirms. ?             Anus:  normal sphincter tone, no lesions ? ?Chaperone was present for exam:  Raquel Sarna, RN ? ?Assessment:   ?Well woman visit with gynecologic exam. ?Menopausal symptoms.  ?HTN.    ?On Micronor.  Recently stopped for hormonal testing.  ?Enlarged thyroid with left thyroid nodule.  Endocrinology managing ?Yeast infection.  ?Urinary with abnormal odor.  ? ?Plan: ?Mammogram screening discussed. ?Self breast awareness reviewed. ?Pap and HR HPV as above. ?Guidelines for Calcium, Vitamin D, regular exercise program including cardiovascular and weight bearing exercise. ?[place] and estradiol check.   ?Rx for Diflucan. ?Urinalysis:  sg 1.025, pH 6.0, 6 - 10 WBC, 3 - 10 RBC, 6 - 10 squams, moderate bacteria, few yeast, moderate mucus. ?UC sent.  ?Follow up annually and prn.  ? ?After visit summary provided.  ? ? ? ?

## 2021-06-13 ENCOUNTER — Encounter: Payer: Self-pay | Admitting: Obstetrics and Gynecology

## 2021-06-13 ENCOUNTER — Ambulatory Visit (INDEPENDENT_AMBULATORY_CARE_PROVIDER_SITE_OTHER): Payer: Managed Care, Other (non HMO) | Admitting: Obstetrics and Gynecology

## 2021-06-13 VITALS — BP 148/88 | HR 87 | Ht 64.5 in | Wt 200.0 lb

## 2021-06-13 DIAGNOSIS — N951 Menopausal and female climacteric states: Secondary | ICD-10-CM | POA: Diagnosis not present

## 2021-06-13 DIAGNOSIS — Z01419 Encounter for gynecological examination (general) (routine) without abnormal findings: Secondary | ICD-10-CM | POA: Diagnosis not present

## 2021-06-13 DIAGNOSIS — R8290 Unspecified abnormal findings in urine: Secondary | ICD-10-CM | POA: Diagnosis not present

## 2021-06-13 DIAGNOSIS — B379 Candidiasis, unspecified: Secondary | ICD-10-CM

## 2021-06-13 MED ORDER — FLUCONAZOLE 150 MG PO TABS: 150.0000 mg | ORAL_TABLET | Freq: Once | ORAL | 0 refills | Status: AC

## 2021-06-13 NOTE — Patient Instructions (Signed)

## 2021-06-14 LAB — ESTRADIOL: Estradiol: 85 pg/mL

## 2021-06-14 LAB — FOLLICLE STIMULATING HORMONE: FSH: 2.9 m[IU]/mL

## 2021-06-15 LAB — URINALYSIS, COMPLETE W/RFL CULTURE
Bilirubin Urine: NEGATIVE
Glucose, UA: NEGATIVE
Hyaline Cast: NONE SEEN /LPF
Ketones, ur: NEGATIVE
Nitrites, Initial: NEGATIVE
Protein, ur: NEGATIVE
Specific Gravity, Urine: 1.025 (ref 1.001–1.035)
pH: 6 (ref 5.0–8.0)

## 2021-06-15 LAB — URINE CULTURE
MICRO NUMBER:: 13278419
SPECIMEN QUALITY:: ADEQUATE

## 2021-06-15 LAB — CULTURE INDICATED

## 2021-06-15 MED ORDER — NORETHINDRONE 0.35 MG PO TABS: 1.0000 | ORAL_TABLET | Freq: Every day | ORAL | 3 refills | Status: DC

## 2021-06-15 NOTE — Addendum Note (Signed)
Addended by: Yisroel Ramming, Dietrich Pates E on: 06/15/2021 08:15 PM ? ? Modules accepted: Orders ? ?

## 2021-06-16 ENCOUNTER — Ambulatory Visit
Admission: EM | Admit: 2021-06-16 | Discharge: 2021-06-16 | Disposition: A | Discharge status: Home / Self Care | Payer: Managed Care, Other (non HMO) | Attending: Physician Assistant | Admitting: Physician Assistant

## 2021-06-16 ENCOUNTER — Ambulatory Visit (HOSPITAL_COMMUNITY)
Admission: EM | Admit: 2021-06-16 | Discharge: 2021-06-17 | Disposition: A | Discharge status: Home / Self Care | Payer: Commercial Managed Care - HMO | Attending: Surgery | Admitting: Surgery

## 2021-06-16 ENCOUNTER — Emergency Department (HOSPITAL_COMMUNITY): Payer: Commercial Managed Care - HMO

## 2021-06-16 ENCOUNTER — Encounter (HOSPITAL_COMMUNITY): Payer: Self-pay

## 2021-06-16 ENCOUNTER — Other Ambulatory Visit: Payer: Self-pay

## 2021-06-16 DIAGNOSIS — K760 Fatty (change of) liver, not elsewhere classified: Secondary | ICD-10-CM | POA: Insufficient documentation

## 2021-06-16 DIAGNOSIS — R1013 Epigastric pain: Secondary | ICD-10-CM | POA: Diagnosis not present

## 2021-06-16 DIAGNOSIS — K819 Cholecystitis, unspecified: Secondary | ICD-10-CM

## 2021-06-16 DIAGNOSIS — E669 Obesity, unspecified: Secondary | ICD-10-CM | POA: Diagnosis not present

## 2021-06-16 DIAGNOSIS — R110 Nausea: Secondary | ICD-10-CM | POA: Diagnosis not present

## 2021-06-16 DIAGNOSIS — I10 Essential (primary) hypertension: Secondary | ICD-10-CM | POA: Insufficient documentation

## 2021-06-16 DIAGNOSIS — K810 Acute cholecystitis: Secondary | ICD-10-CM | POA: Diagnosis present

## 2021-06-16 DIAGNOSIS — E119 Type 2 diabetes mellitus without complications: Secondary | ICD-10-CM | POA: Diagnosis not present

## 2021-06-16 DIAGNOSIS — Z6833 Body mass index (BMI) 33.0-33.9, adult: Secondary | ICD-10-CM | POA: Diagnosis not present

## 2021-06-16 DIAGNOSIS — K8010 Calculus of gallbladder with chronic cholecystitis without obstruction: Secondary | ICD-10-CM | POA: Diagnosis not present

## 2021-06-16 LAB — COMPREHENSIVE METABOLIC PANEL
ALT: 20 U/L (ref 0–44)
AST: 27 U/L (ref 15–41)
Albumin: 3.8 g/dL (ref 3.5–5.0)
Alkaline Phosphatase: 119 U/L (ref 38–126)
Anion gap: 8 (ref 5–15)
BUN: 21 mg/dL — ABNORMAL HIGH (ref 6–20)
CO2: 27 mmol/L (ref 22–32)
Calcium: 9.4 mg/dL (ref 8.9–10.3)
Chloride: 104 mmol/L (ref 98–111)
Creatinine, Ser: 0.89 mg/dL (ref 0.44–1.00)
GFR, Estimated: >60 mL/min (ref >60)
Glucose, Bld: 134 mg/dL — ABNORMAL HIGH (ref 70–99)
Potassium: 4.3 mmol/L (ref 3.5–5.1)
Sodium: 139 mmol/L (ref 135–145)
Total Bilirubin: 0.6 mg/dL (ref 0.3–1.2)
Total Protein: 7.6 g/dL (ref 6.5–8.1)

## 2021-06-16 LAB — CBC
HCT: 44.3 % (ref 36.0–46.0)
Hemoglobin: 14.6 g/dL (ref 12.0–15.0)
MCH: 29.4 pg (ref 26.0–34.0)
MCHC: 33 g/dL (ref 30.0–36.0)
MCV: 89.3 fL (ref 80.0–100.0)
Platelets: 285 10*3/uL (ref 150–400)
RBC: 4.96 MIL/uL (ref 3.87–5.11)
RDW: 13.9 % (ref 11.5–15.5)
WBC: 15.3 10*3/uL — ABNORMAL HIGH (ref 4.0–10.5)
nRBC: 0 % (ref 0.0–0.2)

## 2021-06-16 LAB — URINALYSIS, ROUTINE W REFLEX MICROSCOPIC
Bilirubin Urine: NEGATIVE
Glucose, UA: 50 mg/dL — AB
Hgb urine dipstick: NEGATIVE
Ketones, ur: NEGATIVE mg/dL
Leukocytes,Ua: NEGATIVE
Nitrite: NEGATIVE
Protein, ur: NEGATIVE mg/dL
Specific Gravity, Urine: 1.017 (ref 1.005–1.030)
pH: 6 (ref 5.0–8.0)

## 2021-06-16 LAB — TROPONIN I (HIGH SENSITIVITY)
Troponin I (High Sensitivity): 5 ng/L (ref <18)
Troponin I (High Sensitivity): 7 ng/L (ref <18)

## 2021-06-16 LAB — LIPASE, BLOOD: Lipase: 31 U/L (ref 11–51)

## 2021-06-16 MED ORDER — METHOCARBAMOL 500 MG PO TABS
750.0000 mg | ORAL_TABLET | Freq: Four times a day (QID) | ORAL | Status: DC
  Administered 2021-06-16: 750 mg via ORAL
  Filled 2021-06-16: qty 2

## 2021-06-16 MED ORDER — METHOCARBAMOL 1000 MG/10ML IJ SOLN
750.0000 mg | Freq: Four times a day (QID) | INTRAVENOUS | Status: DC
  Filled 2021-06-16: qty 7.5

## 2021-06-16 MED ORDER — CIPROFLOXACIN IN D5W 400 MG/200ML IV SOLN
400.0000 mg | Freq: Two times a day (BID) | INTRAVENOUS | Status: DC
  Administered 2021-06-16 – 2021-06-17 (×2): 400 mg via INTRAVENOUS
  Filled 2021-06-16 (×2): qty 200

## 2021-06-16 MED ORDER — ONDANSETRON HCL 4 MG/2ML IJ SOLN
4.0000 mg | Freq: Once | INTRAMUSCULAR | Status: AC
  Administered 2021-06-16: 4 mg via INTRAVENOUS
  Filled 2021-06-16: qty 2

## 2021-06-16 MED ORDER — KETOROLAC TROMETHAMINE 15 MG/ML IJ SOLN
30.0000 mg | Freq: Four times a day (QID) | INTRAMUSCULAR | Status: DC
  Administered 2021-06-16 – 2021-06-17 (×2): 30 mg via INTRAVENOUS
  Filled 2021-06-16 (×2): qty 2

## 2021-06-16 MED ORDER — ONDANSETRON 4 MG PO TBDP
4.0000 mg | ORAL_TABLET | Freq: Once | ORAL | Status: AC
  Administered 2021-06-16: 4 mg via ORAL

## 2021-06-16 MED ORDER — SODIUM CHLORIDE 0.9 % IV BOLUS
1000.0000 mL | Freq: Once | INTRAVENOUS | Status: AC
  Administered 2021-06-16: 1000 mL via INTRAVENOUS

## 2021-06-16 MED ORDER — OXYCODONE HCL 5 MG/5ML PO SOLN: 5.0000 mg | ORAL | Status: DC | PRN

## 2021-06-16 MED ORDER — ONDANSETRON 4 MG PO TBDP: 4.0000 mg | ORAL_TABLET | Freq: Four times a day (QID) | ORAL | Status: DC | PRN

## 2021-06-16 MED ORDER — TRAMADOL HCL 50 MG PO TABS
50.0000 mg | ORAL_TABLET | Freq: Once | ORAL | Status: AC
  Administered 2021-06-16: 50 mg via ORAL
  Filled 2021-06-16: qty 1

## 2021-06-16 MED ORDER — FAMOTIDINE IN NACL 20-0.9 MG/50ML-% IV SOLN
20.0000 mg | Freq: Once | INTRAVENOUS | Status: AC
  Administered 2021-06-16: 20 mg via INTRAVENOUS
  Filled 2021-06-16: qty 50

## 2021-06-16 MED ORDER — MORPHINE SULFATE (PF) 2 MG/ML IV SOLN: 2.0000 mg | INTRAVENOUS | Status: DC | PRN

## 2021-06-16 MED ORDER — ONDANSETRON HCL 4 MG/2ML IJ SOLN: 4.0000 mg | Freq: Four times a day (QID) | INTRAMUSCULAR | Status: DC | PRN

## 2021-06-16 MED ORDER — ENOXAPARIN SODIUM 30 MG/0.3ML IJ SOSY
30.0000 mg | PREFILLED_SYRINGE | Freq: Two times a day (BID) | INTRAMUSCULAR | Status: DC
  Administered 2021-06-16 – 2021-06-17 (×2): 30 mg via SUBCUTANEOUS
  Filled 2021-06-16 (×2): qty 0.3

## 2021-06-16 MED ORDER — DOCUSATE SODIUM 100 MG PO CAPS
100.0000 mg | ORAL_CAPSULE | Freq: Two times a day (BID) | ORAL | Status: DC
  Administered 2021-06-16: 100 mg via ORAL
  Filled 2021-06-16: qty 1

## 2021-06-16 MED ORDER — LACTATED RINGERS IV SOLN: INTRAVENOUS | Status: DC

## 2021-06-16 MED ORDER — ACETAMINOPHEN 500 MG PO TABS
1000.0000 mg | ORAL_TABLET | Freq: Four times a day (QID) | ORAL | Status: DC
  Administered 2021-06-16 – 2021-06-17 (×2): 1000 mg via ORAL
  Filled 2021-06-16 (×2): qty 2

## 2021-06-16 NOTE — ED Notes (Signed)
Pt ambulated self to bathroom, given urine cup for specimen collection per urinalysis order ?

## 2021-06-16 NOTE — ED Triage Notes (Signed)
Pt sent by UC for epigastric pain that radiates to mid chest started today. Pt states has nausea. Pt denies V?D. Pt states has SOB, but believes it due to the pain. Pt breathing shallow in triage and seems to be in severe pain. ?

## 2021-06-16 NOTE — ED Triage Notes (Signed)
Pt c/o "severe abdominal pain and it's in my chest" states it started earlier today but has gotten severe over last 2 hours. Continues to explain the chest feels tight, when asked where worse pain is pt points to epigastric region. Nausea, chills,  ? ?Denies vomiting, diarrhea, constipation, body aches,  ?

## 2021-06-16 NOTE — Discharge Instructions (Signed)
I am concerned that you have something serious going on in your abdomen.  Please go directly to the emergency room for further evaluation and management since we do not have imaging capabilities in urgent care. ?

## 2021-06-16 NOTE — ED Provider Notes (Signed)
?[facility] ? ? ? ?CSN: [id number] ?Arrival date & time: 06/16/21  1550 ? ? ?  ? ?History   ?Chief Complaint ?Chief Complaint  ?Patient presents with  ? abd/chest pain  ? ? ?HPI ?Meghan Lyons is a 56 y.o. female.  ? ?Patient presents today with a several hour history of epigastric abdominal pain that is worsened significantly in the past 2 hours.  She reports that pain is rated greater than 10 on a 0-10 pain scale, described as intense gnawing, no aggravating relieving factors notified.  She reports nausea but denies any vomiting.  Denies any constipation, melena, hematochezia.  She reports her last bowel movement was yesterday and was normal.  Denies any recent dietary changes or increase alcohol consumption.  She denies history of gastrointestinal disorder including peptic ulcer disease or pancreatitis.  She does not take GLP-1 agonist.  Denies any recent travel, medication changes, known sick contacts.  She denies previous abdominal surgeries outside of C-section; still has gallbladder and appendix.  She reports pain is terrible and she is unable to sit still due to severity of pain. ? ? ?Past Medical History:  ?Diagnosis Date  ? DDD (degenerative disc disease), lumbar 11/2008  ? Diabetes mellitus 03/2010  ? Dr. Chalmers Cater  ? Elevated alkaline phosphatase level   ? Fatty liver 12/11  ? bx revealed--granuloma  ? Hyperlipidemia 07/2010  ? Hypertension   ? Hypertriglyceridemia   ? Polymyalgia ([place])   ? Vitamin D deficiency disease 2009  ? ? ?Patient Active Problem List  ? Diagnosis Date Noted  ? Menorrhagia 08/16/2012  ? ? ?Past Surgical History:  ?Procedure Laterality Date  ? CESAREAN SECTION  10/89 & 5/96  ? LUMBAR SPINE SURGERY  11/19/2012  ? L4-L5 fusion  ? WISDOM TOOTH EXTRACTION  age 25  ? ? ?OB History   ? ? Gravida  ?2  ? Para  ?2  ? Term  ?2  ? Preterm  ?0  ? AB  ?0  ? Living  ?2  ?  ? ? SAB  ?0  ? IAB  ?0  ? Ectopic  ?0  ? Multiple  ?0  ? Live Births  ?2  ?   ?  ?  ? ? ? ?Home Medications    ? ?Prior to Admission medications   ?Medication Sig Start Date End Date Taking? Authorizing Provider  ?Biotin 800 MCG TABS 1 tablet    [provider]  ?calcium carbonate (OS-CAL) 600 MG TABS Take 600 mg by mouth 2 (two) times daily with a meal.    [provider]  ?Cyanocobalamin (B-12) 5000 MCG CAPS Take by mouth daily.    [provider]  ?lisinopril-hydrochlorothiazide (PRINZIDE,ZESTORETIC) 20-12.5 MG tablet Take 2 tablets by mouth daily.     [provider]  ?meloxicam (MOBIC) 15 MG tablet Take 15 mg by mouth daily.    [provider]  ?Multiple Vitamin (MULTIVITAMIN) tablet Take 1 tablet by mouth daily.    [provider]  ?norethindrone (MICRONOR) 0.35 MG tablet Take 1 tablet (0.35 mg total) by mouth daily. Stop taking pills 2 weeks prior to appointment. 06/15/21   Nunzio Cobbs, MD  ?Omega-3 Fatty Acids (FISH OIL) 1200 MG CAPS Take 1 capsule by mouth 3 (three) times daily.    [provider]  ?potassium chloride SA (KLOR-CON) 20 MEQ tablet Take 1 tablet by mouth 2 (two) times daily.    [provider]  ?rosuvastatin (CRESTOR) 5 MG  tablet 1 tablet 05/05/21   [provider]  ?thiamine 100 MG tablet 1 tablet    [provider]  ?traMADol (ULTRAM) 50 MG tablet 1 tablet as needed    [provider]  ?Vitamin D, Ergocalciferol, 50 MCG (2000 UT) CAPS 1 capsule    [provider]  ?norethindrone (AYGESTIN) 5 MG tablet Take 1 tablet (5 mg total) by mouth as directed. Take as directed 06/10/12 06/11/12  Kem Boroughs, [place]  ? ? ?Family History ?Family History  ?Problem Relation Age of Onset  ? Thyroid disease Mother   ? Hypertension Mother   ? Diabetes Maternal Grandmother   ? Coronary artery disease Maternal Grandmother   ? Coronary artery disease Father   ? Hypertension Sister   ? Coronary artery disease Maternal Grandfather   ? Rheum arthritis Paternal Grandmother   ? ? ?Social History ?Social  History  ? ?Tobacco Use  ? Smoking status: Never  ? Smokeless tobacco: Never  ?Vaping Use  ? Vaping Use: Never used  ?Substance Use Topics  ? Alcohol use: No  ?  Comment: rare  ? Drug use: No  ? ? ? ?Allergies   ?Cephalexin, Phentermine-topiramate, and Cephalexin ? ? ?Review of Systems ?Review of Systems  ?Constitutional:  Positive for activity change. Negative for appetite change, fatigue and fever.  ?Gastrointestinal:  Positive for abdominal pain and nausea. Negative for blood in stool, constipation, diarrhea and vomiting.  ?Neurological:  Negative for dizziness, light-headedness and headaches.  ? ? ?Physical Exam ?Triage Vital Signs ?ED Triage Vitals  ?Enc Vitals Group  ?   BP 06/16/21 1648 (!) 149/118  ?   Pulse Rate 06/16/21 1648 76  ?   Resp 06/16/21 1648 18  ?   Temp 06/16/21 1648 98.1 ?F (36.7 ?C)  ?   Temp Source 06/16/21 1648 Oral  ?   SpO2 06/16/21 1648 98 %  ?   Weight --   ?   Height --   ?   Head Circumference --   ?   Peak Flow --   ?   Pain Score 06/16/21 1649 0  ?   Pain Loc --   ?   Pain Edu? --   ?   Excl. in [place]? --   ? ?No data found. ? ?Updated Vital Signs ?BP (!) 149/118 (BP Location: Left Arm)   Pulse 76   Temp 98.1 ?F (36.7 ?C) (Oral)   Resp 18   LMP 05/23/2021 (Approximate)   SpO2 98%  ? ?Visual Acuity ?Right Eye Distance:   ?Left Eye Distance:   ?Bilateral Distance:   ? ?Right Eye Near:   ?Left Eye Near:    ?Bilateral Near:    ? ?Physical Exam ?Vitals reviewed.  ?Constitutional:   ?   General: She is awake. She is not in acute distress. ?   Appearance: Normal appearance. She is well-developed. She is ill-appearing.  ?   Comments: Very pleasant female obviously uncomfortable holding stomach and groaning sitting on exam table in no acute distress  ?HENT:  ?   Head: Normocephalic and atraumatic.  ?Cardiovascular:  ?   Rate and Rhythm: Normal rate and regular rhythm.  ?   Heart sounds: Normal heart sounds, S1 normal and S2 normal. No murmur heard. ?Pulmonary:  ?   Effort: Pulmonary effort  is normal.  ?   Breath sounds: Normal breath sounds. No wheezing, rhonchi or rales.  ?   Comments: Clear to auscultation bilaterally ?Abdominal:  ?  General: Bowel sounds are normal.  ?   Palpations: Abdomen is soft.  ?   Tenderness: There is abdominal tenderness in the right upper quadrant, epigastric area and left upper quadrant. There is guarding. There is no right CVA tenderness, left CVA tenderness or rebound.  ?   Comments: Significant tenderness to palpation with guarding in upper abdomen  ?Psychiatric:     ?   Behavior: Behavior is cooperative.  ? ? ? ?UC Treatments / Results  ?Labs ?(all labs ordered are listed, but only abnormal results are displayed) ?Labs Reviewed - No data to display ? ?EKG ? ? ?Radiology ?No results found. ? ?Procedures ?Procedures (including critical care time) ? ?Medications Ordered in UC ?Medications  ?ondansetron (ZOFRAN-ODT) disintegrating tablet 4 mg (4 mg Oral Given 06/16/21 1704)  ? ? ?Initial Impression / Assessment and Plan / UC Course  ?I have reviewed the triage vital signs and the nursing notes. ? ?Pertinent labs & imaging results that were available during my care of the patient were reviewed by me and considered in my medical decision making (see chart for details). ? ?  ? ?EKG obtained showed normal sinus rhythm with ventricular rate of 73 bpm without ischemic changes; no previous to compare.  Patient was given Zofran in clinic without improvement of symptoms.  She had significant tenderness to palpation on exam and discussed that we do not have imaging capabilities in urgent care and recommended she go to the emergency room.  She is agreeable to this and will have her husband transport her directly to ER following visit.  Vital signs were stable for private vehicle transport at the time of discharge. ? ?Final Clinical Impressions(s) / UC Diagnoses  ? ?Final diagnoses:  ?Epigastric abdominal pain  ?Nausea without vomiting  ? ? ? ?Discharge Instructions   ? ?  ?I am  concerned that you have something serious going on in your abdomen.  Please go directly to the emergency room for further evaluation and management since we do not have imaging capabilities in urgent care. ? ? ? ?E

## 2021-06-16 NOTE — ED Provider Notes (Signed)
?[facility] ?Provider Note ? ? ?CSN: [id number] ?Arrival date & time: 06/16/21  1724 ? ?  ? ?History ? ?Chief Complaint  ?Patient presents with  ? Abdominal Pain  ? ? ?Meghan Lyons is a 56 y.o. female history of hypertension, here with abdominal pain and chest pain.  Patient states that around noon time, she had a cute onset of epigastric pain that radiated to her chest.  She states that it was very severe and got progressively worse.  She went to urgent care and was sent here for further evaluation.  She states that she was given some nausea medicine at urgent care and felt slightly better.  She denies any lower abdominal pain.  Denies any history of CAD or stents. ? ?The history is provided by the patient.  ? ?  ? ?Home Medications ?Prior to Admission medications   ?Medication Sig Start Date End Date Taking? Authorizing Provider  ?Biotin 800 MCG TABS 1 tablet    [provider]  ?calcium carbonate (OS-CAL) 600 MG TABS Take 600 mg by mouth 2 (two) times daily with a meal.    [provider]  ?Cyanocobalamin (B-12) 5000 MCG CAPS Take by mouth daily.    [provider]  ?lisinopril-hydrochlorothiazide (PRINZIDE,ZESTORETIC) 20-12.5 MG tablet Take 2 tablets by mouth daily.     [provider]  ?meloxicam (MOBIC) 15 MG tablet Take 15 mg by mouth daily.    [provider]  ?Multiple Vitamin (MULTIVITAMIN) tablet Take 1 tablet by mouth daily.    [provider]  ?norethindrone (MICRONOR) 0.35 MG tablet Take 1 tablet (0.35 mg total) by mouth daily. Stop taking pills 2 weeks prior to appointment. 06/15/21   Nunzio Cobbs, MD  ?Omega-3 Fatty Acids (FISH OIL) 1200 MG CAPS Take 1 capsule by mouth 3 (three) times daily.    [provider]  ?potassium chloride SA (KLOR-CON) 20 MEQ tablet Take 1 tablet by mouth 2 (two) times daily.    [provider]  ?rosuvastatin (CRESTOR) 5 MG tablet 1 tablet  05/05/21   [provider]  ?thiamine 100 MG tablet 1 tablet    [provider]  ?traMADol (ULTRAM) 50 MG tablet 1 tablet as needed    [provider]  ?Vitamin D, Ergocalciferol, 50 MCG (2000 UT) CAPS 1 capsule    [provider]  ?norethindrone (AYGESTIN) 5 MG tablet Take 1 tablet (5 mg total) by mouth as directed. Take as directed 06/10/12 06/11/12  Kem Boroughs, [place]  ?   ? ?Allergies    ?Cephalexin, Phentermine-topiramate, and Cephalexin   ? ?Review of Systems   ?Review of Systems  ?Cardiovascular:  Positive for chest pain.  ?Gastrointestinal:  Positive for abdominal pain.  ?All other systems reviewed and are negative. ? ?Physical Exam ?Updated Vital Signs ?BP (!) 150/89   Pulse 74   Temp 99.3 ?F (37.4 ?C) (Oral)   Resp 11   Ht 5' 4.5" (1.638 m)   Wt 90.7 kg   LMP 05/23/2021 (Approximate)   SpO2 96%   BMI 33.79 kg/m?  ?Physical Exam ?Vitals and nursing note reviewed.  ?Constitutional:   ?   Comments: Uncomfortable  ?HENT:  ?   Head: Normocephalic.  ?Eyes:  ?   Extraocular Movements: Extraocular movements intact.  ?Cardiovascular:  ?   Rate and Rhythm: Normal rate and regular rhythm.  ?   Heart sounds: Normal heart sounds.  ?Pulmonary:  ?   Effort: Pulmonary  effort is normal.  ?   Breath sounds: Normal breath sounds.  ?Abdominal:  ?   General: Abdomen is flat.  ?   Comments: + RUQ tenderness, ? Murphy sign   ?Skin: ?   General: Skin is warm.  ?   Capillary Refill: Capillary refill takes less than 2 seconds.  ?Neurological:  ?   General: No focal deficit present.  ?   Mental Status: She is oriented to person, place, and time.  ?Psychiatric:     ?   Mood and Affect: Mood normal.     ?   Behavior: Behavior normal.  ? ? ?ED Results / Procedures / Treatments   ?Labs ?(all labs ordered are listed, but only abnormal results are displayed) ?Labs Reviewed  ?COMPREHENSIVE METABOLIC PANEL - Abnormal; Notable for the following components:  ?    Result Value  ? Glucose, Bld 134 (*)    ? BUN 21 (*)   ? All other components within normal limits  ?CBC - Abnormal; Notable for the following components:  ? WBC 15.3 (*)   ? All other components within normal limits  ?LIPASE, BLOOD  ?URINALYSIS, ROUTINE W REFLEX MICROSCOPIC  ?TROPONIN I (HIGH SENSITIVITY)  ? ? ?EKG ?EKG Interpretation ? ?Date/Time:  Friday June 16 2021 17:32:28 EDT ?Ventricular Rate:  72 ?PR Interval:  148 ?QRS Duration: 80 ?QT Interval:  398 ?QTC Calculation: 435 ?R Axis:   58 ?Text Interpretation: Normal sinus rhythm Normal ECG When compared with ECG of 16-Jun-2021 16:55, PREVIOUS ECG IS PRESENT Confirmed by Wandra Arthurs [phone]) on 06/16/2021 6:43:43 PM ? ?Radiology ?No results found. ? ?Procedures ?Procedures  ? ? ?Medications Ordered in ED ?Medications  ?sodium chloride 0.9 % bolus 1,000 mL (has no administration in time range)  ?ondansetron (ZOFRAN) injection 4 mg (has no administration in time range)  ?famotidine (PEPCID) IVPB 20 mg premix (has no administration in time range)  ? ? ?ED Course/ Medical Decision Making/ A&P ?  ?                        ?Medical Decision Making ?Meghan Lyons is a 56 y.o. female here presenting with chest pain and right upper quadrant pain.  I think she likely has biliary colic versus pancreatitis versus gastritis.  Low suspicion for ACS.  We will get CBC and CMP and troponin x2 and lipase and also right upper quadrant ultrasound.  Will give pain medicine and IV fluids and reassess. ? ?8:08 PM ?White blood cell count is 15.  Ultrasound showed stone in the gallbladder with pericholecystic fluid and wall thickening.  There was concern for acute cholecystitis.  I consulted Dr. Bobbye Morton from surgery and she will admit patient ? ?Problems Addressed: ?Cholecystitis: acute illness or injury ? ?Amount and/or Complexity of Data Reviewed ?Labs: ordered. Decision-making details documented in ED Course. ?Radiology: ordered and independent interpretation performed. Decision-making details documented in ED  Course. ?ECG/medicine tests: ordered and independent interpretation performed. Decision-making details documented in ED Course. ? ?Risk ?Prescription drug management. ?Decision regarding hospitalization. ? ?Final Clinical Impression(s) / ED Diagnoses ?Final diagnoses:  ?None  ? ? ?Rx / DC Orders ?ED Discharge Orders   ? ? None  ? ?  ? ? ?  ?Drenda Freeze, MD ?06/16/21 2009 ? ?

## 2021-06-16 NOTE — H&P (Signed)
? ? ?Reason for Consult/Chief Complaint:acute cholecystitis ?Consultant: [name], MD ? ?Meghan Lyons is an 56 y.o. female.  ? ?HPI: 32F with one day history of epigastric and RUQ abdominal pain. Similar episode one year ago. Associated nausea, no vomiting. Last BM 4/20. CS x2.  ? ?Past Medical History:  ?Diagnosis Date  ? DDD (degenerative disc disease), lumbar 11/2008  ? Diabetes mellitus 03/2010  ? Dr. Chalmers Cater  ? Elevated alkaline phosphatase level   ? Fatty liver 12/11  ? bx revealed--granuloma  ? Hyperlipidemia 07/2010  ? Hypertension   ? Hypertriglyceridemia   ? Polymyalgia ([place])   ? Vitamin D deficiency disease 2009  ? ? ?Past Surgical History:  ?Procedure Laterality Date  ? CESAREAN SECTION  10/89 & 5/96  ? LUMBAR SPINE SURGERY  11/19/2012  ? L4-L5 fusion  ? WISDOM TOOTH EXTRACTION  age 80  ? ? ?Family History  ?Problem Relation Age of Onset  ? Thyroid disease Mother   ? Hypertension Mother   ? Diabetes Maternal Grandmother   ? Coronary artery disease Maternal Grandmother   ? Coronary artery disease Father   ? Hypertension Sister   ? Coronary artery disease Maternal Grandfather   ? Rheum arthritis Paternal Grandmother   ? ? ?Social History:  reports that she has never smoked. She has never used smokeless tobacco. She reports that she does not drink alcohol and does not use drugs. ? ?Allergies:  ?Allergies  ?Allergen Reactions  ? Cephalexin Other (See Comments)  ? Phentermine-Topiramate Other (See Comments)  ? Cephalexin Swelling and Rash  ? ? ?Medications: I have reviewed the patient's current medications. ? ?Results for orders placed or performed during the hospital encounter of 06/16/21 (from the past 48 hour(s))  ?Lipase, blood     Status: None  ? Collection Time: 06/16/21  5:35 PM  ?Result Value Ref Range  ? Lipase 31 11 - 51 U/L  ?  Comment: Performed at [facility], [place] [address] [place], [place] [address]  ?Comprehensive metabolic panel     Status: Abnormal  ? Collection Time: 06/16/21   5:35 PM  ?Result Value Ref Range  ? Sodium 139 135 - 145 mmol/L  ? Potassium 4.3 3.5 - 5.1 mmol/L  ? Chloride 104 98 - 111 mmol/L  ? CO2 27 22 - 32 mmol/L  ? Glucose, Bld 134 (H) 70 - 99 mg/dL  ?  Comment: Glucose reference range applies only to samples taken after fasting for at least 8 hours.  ? BUN 21 (H) 6 - 20 mg/dL  ? Creatinine, Ser 0.89 0.44 - 1.00 mg/dL  ? Calcium 9.4 8.9 - 10.3 mg/dL  ? Total Protein 7.6 6.5 - 8.1 g/dL  ? Albumin 3.8 3.5 - 5.0 g/dL  ? AST 27 15 - 41 U/L  ? ALT 20 0 - 44 U/L  ? Alkaline Phosphatase 119 38 - 126 U/L  ? Total Bilirubin 0.6 0.3 - 1.2 mg/dL  ? GFR, Estimated >60 >60 mL/min  ?  Comment: (NOTE) ?Calculated using the CKD-EPI Creatinine Equation (2021) ?  ? Anion gap 8 5 - 15  ?  Comment: Performed at [facility], [place] [address] [place], [place] [address]  ?CBC     Status: Abnormal  ? Collection Time: 06/16/21  5:35 PM  ?Result Value Ref Range  ? WBC 15.3 (H) 4.0 - 10.5 K/uL  ? RBC 4.96 3.87 - 5.11 MIL/uL  ? Hemoglobin 14.6 12.0 - 15.0 g/dL  ? HCT  44.3 36.0 - 46.0 %  ? MCV 89.3 80.0 - 100.0 fL  ? MCH 29.4 26.0 - 34.0 pg  ? MCHC 33.0 30.0 - 36.0 g/dL  ? RDW 13.9 11.5 - 15.5 %  ? Platelets 285 150 - 400 K/uL  ? nRBC 0.0 0.0 - 0.2 %  ?  Comment: Performed at [facility], [place] [address] [place], [place] [address]  ?Troponin I (High Sensitivity)     Status: None  ? Collection Time: 06/16/21  5:35 PM  ?Result Value Ref Range  ? Troponin I (High Sensitivity) 5 <18 ng/L  ?  Comment: (NOTE) ?Elevated high sensitivity troponin I (hsTnI) values and significant  ?changes across serial measurements may suggest ACS but many other  ?chronic and acute conditions are known to elevate hsTnI results.  ?Refer to the "Links" section for chest pain algorithms and additional  ?guidance. ?Performed at [facility], [place] [address] [place], [place] ?[address] ?  ? ? ?DG Chest Port 1 View ? ?Result Date: 06/16/2021 ?CLINICAL DATA:  Chest pain, epigastric pain EXAM: PORTABLE CHEST 1 VIEW  COMPARISON:  05/21/2011 FINDINGS: The heart size and mediastinal contours are within normal limits. Both lungs are clear. The visualized skeletal structures are unremarkable. IMPRESSION: No active disease. Electronically Signed   By: Elmer Picker M.D.   On: 06/16/2021 19:51  ? ?US Abdomen Limited RUQ (LIVER/GB) ? ?Result Date: 06/16/2021 ?CLINICAL DATA:  Right upper quadrant pain EXAM: ULTRASOUND ABDOMEN LIMITED RIGHT UPPER QUADRANT COMPARISON:  08/07/2010 ultrasound of the abdomen FINDINGS: Evaluation is somewhat limited by overlying bowel gas. Gallbladder: 2 cm stone in the gallbladder neck, which was non mobile. Trace pericholecystic fluid. No sonographic Murphy's sign reported by sonographer. Wall thickening, up to 5 mm. Common bile duct: Diameter: 5 mm Liver: No focal lesion identified. Slightly increased parenchymal echogenicity. Portal vein is patent on color Doppler imaging with normal direction of blood flow towards the liver. Other: None. IMPRESSION: 1. Non mobile stone in the gallbladder neck, with trace pericholecystic fluid and mild wall thickening, as can be seen in the setting of cholecystitis. 2. Hepatic steatosis. Electronically Signed   By: Merilyn Baba M.D.   On: 06/16/2021 19:49   ? ?ROS ?10 point review of systems is negative except as listed above in HPI.  ? ?Physical Exam ?Blood pressure (!) 141/82, pulse 80, temperature 99.3 ?F (37.4 ?C), temperature source Oral, resp. rate 16, height 5' 4.5" (1.638 m), weight 90.7 kg, last menstrual period 05/23/2021, SpO2 99 %. ?Constitutional: well-developed, well-nourished ?HEENT: pupils equal, round, reactive to light, 37m b/l, moist conjunctiva, external inspection of ears and nose normal, hearing intact ?Oropharynx: normal oropharyngeal mucosa, normal dentition ?Neck: no thyromegaly, trachea midline, no midline cervical tenderness to palpation ?Chest: breath sounds equal bilaterally, normal respiratory effort, no midline or lateral chest wall  tenderness to palpation/deformity ?Abdomen: soft, RUQ TTP, UH, no bruising, no hepatosplenomegaly ?GU: normal female genitalia  ?Back: no wounds, no thoracic/lumbar spine tenderness to palpation, no thoracic/lumbar spine stepoffs ?Rectal: deferred ?Extremities: 2+ radial and pedal pulses bilaterally, intact motor and sensation bilateral UE and LE, no peripheral edema ?MSK: normal gait/station, no clubbing/cyanosis of fingers/toes, normal ROM of all four extremities ?Skin: warm, dry, no rashes ?Psych: normal memory, normal mood/affect  ? ?  ?Assessment/Plan: ?88F with acute cholecystitis. Plan for lap chole 4/22. Informed consent was obtained after detailed explanation of risks, including bleeding, infection, biloma, hematoma, injury to common bile duct, need for IOC to delineate anatomy,  and need for conversion to open procedure. All questions answered to the patient's satisfaction. ? ? ?Jesusita Oka, MD ?General and Trauma Surgery ?[facility] ? ? ? ? ? ? ?

## 2021-06-17 ENCOUNTER — Observation Stay (HOSPITAL_BASED_OUTPATIENT_CLINIC_OR_DEPARTMENT_OTHER): Payer: Commercial Managed Care - HMO | Admitting: Certified Registered"

## 2021-06-17 ENCOUNTER — Encounter (HOSPITAL_COMMUNITY)
Admission: EM | Disposition: A | Discharge status: Home / Self Care | Payer: Self-pay | Source: Home / Self Care | Attending: Emergency Medicine

## 2021-06-17 ENCOUNTER — Encounter (HOSPITAL_COMMUNITY): Payer: Self-pay

## 2021-06-17 ENCOUNTER — Observation Stay (HOSPITAL_COMMUNITY): Payer: Commercial Managed Care - HMO | Admitting: Certified Registered"

## 2021-06-17 DIAGNOSIS — K810 Acute cholecystitis: Secondary | ICD-10-CM

## 2021-06-17 HISTORY — PX: CHOLECYSTECTOMY: SHX55

## 2021-06-17 LAB — COMPREHENSIVE METABOLIC PANEL
ALT: 18 U/L (ref 0–44)
AST: 24 U/L (ref 15–41)
Albumin: 3.1 g/dL — ABNORMAL LOW (ref 3.5–5.0)
Alkaline Phosphatase: 97 U/L (ref 38–126)
Anion gap: 8 (ref 5–15)
BUN: 14 mg/dL (ref 6–20)
CO2: 28 mmol/L (ref 22–32)
Calcium: 8.4 mg/dL — ABNORMAL LOW (ref 8.9–10.3)
Chloride: 103 mmol/L (ref 98–111)
Creatinine, Ser: 0.84 mg/dL (ref 0.44–1.00)
GFR, Estimated: >60 mL/min (ref >60)
Glucose, Bld: 95 mg/dL (ref 70–99)
Potassium: 3.2 mmol/L — ABNORMAL LOW (ref 3.5–5.1)
Sodium: 139 mmol/L (ref 135–145)
Total Bilirubin: 1 mg/dL (ref 0.3–1.2)
Total Protein: 6.2 g/dL — ABNORMAL LOW (ref 6.5–8.1)

## 2021-06-17 LAB — GLUCOSE, CAPILLARY: Glucose-Capillary: 136 mg/dL — ABNORMAL HIGH (ref 70–99)

## 2021-06-17 LAB — PREGNANCY, URINE: Preg Test, Ur: NEGATIVE

## 2021-06-17 SURGERY — LAPAROSCOPIC CHOLECYSTECTOMY
Anesthesia: General

## 2021-06-17 MED ORDER — OXYCODONE HCL 5 MG PO TABS: 5.0000 mg | ORAL_TABLET | Freq: Three times a day (TID) | ORAL | 0 refills | Status: DC | PRN

## 2021-06-17 MED ORDER — PROPOFOL 10 MG/ML IV BOLUS
INTRAVENOUS | Status: AC
  Filled 2021-06-17: qty 20

## 2021-06-17 MED ORDER — KETOROLAC TROMETHAMINE 30 MG/ML IJ SOLN: 30.0000 mg | Freq: Once | INTRAMUSCULAR | Status: DC

## 2021-06-17 MED ORDER — OXYCODONE HCL 5 MG PO TABS
5.0000 mg | ORAL_TABLET | Freq: Once | ORAL | Status: AC | PRN
  Administered 2021-06-17: 5 mg via ORAL

## 2021-06-17 MED ORDER — SODIUM CHLORIDE 0.9% FLUSH: 3.0000 mL | INTRAVENOUS | Status: DC | PRN

## 2021-06-17 MED ORDER — FENTANYL CITRATE (PF) 100 MCG/2ML IJ SOLN: 25.0000 ug | INTRAMUSCULAR | Status: DC | PRN

## 2021-06-17 MED ORDER — HYDROMORPHONE HCL 1 MG/ML IJ SOLN
INTRAMUSCULAR | Status: DC | PRN
  Administered 2021-06-17: .5 mg via INTRAVENOUS

## 2021-06-17 MED ORDER — OXYCODONE HCL 5 MG PO TABS
5.0000 mg | ORAL_TABLET | Freq: Three times a day (TID) | ORAL | 0 refills | Status: AC | PRN
Start: 2021-06-17 — End: 2021-06-22

## 2021-06-17 MED ORDER — AMISULPRIDE (ANTIEMETIC) 5 MG/2ML IV SOLN
INTRAVENOUS | Status: AC
  Filled 2021-06-17: qty 4

## 2021-06-17 MED ORDER — OXYCODONE HCL 5 MG PO TABS: 5.0000 mg | ORAL_TABLET | ORAL | Status: DC | PRN

## 2021-06-17 MED ORDER — LABETALOL HCL 5 MG/ML IV SOLN
INTRAVENOUS | Status: AC
  Filled 2021-06-17: qty 4

## 2021-06-17 MED ORDER — LIDOCAINE 2% (20 MG/ML) 5 ML SYRINGE
INTRAMUSCULAR | Status: AC
  Filled 2021-06-17: qty 5

## 2021-06-17 MED ORDER — KETOROLAC TROMETHAMINE 30 MG/ML IJ SOLN
INTRAMUSCULAR | Status: DC | PRN
  Administered 2021-06-17: 30 mg via INTRAVENOUS

## 2021-06-17 MED ORDER — FENTANYL CITRATE (PF) 250 MCG/5ML IJ SOLN
INTRAMUSCULAR | Status: AC
  Filled 2021-06-17: qty 5

## 2021-06-17 MED ORDER — OXYCODONE HCL 5 MG PO TABS
ORAL_TABLET | ORAL | Status: AC
  Filled 2021-06-17: qty 1

## 2021-06-17 MED ORDER — DEXAMETHASONE SODIUM PHOSPHATE 10 MG/ML IJ SOLN
INTRAMUSCULAR | Status: AC
  Filled 2021-06-17: qty 1

## 2021-06-17 MED ORDER — AMISULPRIDE (ANTIEMETIC) 5 MG/2ML IV SOLN
10.0000 mg | Freq: Once | INTRAVENOUS | Status: AC | PRN
  Administered 2021-06-17: 10 mg via INTRAVENOUS

## 2021-06-17 MED ORDER — ACETAMINOPHEN 10 MG/ML IV SOLN
INTRAVENOUS | Status: AC
  Filled 2021-06-17: qty 100

## 2021-06-17 MED ORDER — OXYCODONE HCL 5 MG/5ML PO SOLN: 5.0000 mg | Freq: Once | ORAL | Status: AC | PRN

## 2021-06-17 MED ORDER — ROCURONIUM BROMIDE 10 MG/ML (PF) SYRINGE
PREFILLED_SYRINGE | INTRAVENOUS | Status: AC
  Filled 2021-06-17: qty 10

## 2021-06-17 MED ORDER — SCOPOLAMINE 1 MG/3DAYS TD PT72: 1.0000 | MEDICATED_PATCH | TRANSDERMAL | Status: DC

## 2021-06-17 MED ORDER — LABETALOL HCL 5 MG/ML IV SOLN
5.0000 mg | INTRAVENOUS | Status: AC | PRN
  Administered 2021-06-17 (×4): 5 mg via INTRAVENOUS

## 2021-06-17 MED ORDER — FENTANYL CITRATE (PF) 100 MCG/2ML IJ SOLN
INTRAMUSCULAR | Status: AC
  Filled 2021-06-17: qty 2

## 2021-06-17 MED ORDER — ORAL CARE MOUTH RINSE: 15.0000 mL | Freq: Once | OROMUCOSAL | Status: AC

## 2021-06-17 MED ORDER — DOCUSATE SODIUM 100 MG PO CAPS: 100.0000 mg | ORAL_CAPSULE | Freq: Two times a day (BID) | ORAL | 0 refills | Status: AC

## 2021-06-17 MED ORDER — MIDAZOLAM HCL 2 MG/2ML IJ SOLN
INTRAMUSCULAR | Status: DC | PRN
  Administered 2021-06-17: 2 mg via INTRAVENOUS

## 2021-06-17 MED ORDER — SCOPOLAMINE 1 MG/3DAYS TD PT72
MEDICATED_PATCH | TRANSDERMAL | Status: AC
  Administered 2021-06-17: 1.5 mg via TRANSDERMAL
  Filled 2021-06-17: qty 1

## 2021-06-17 MED ORDER — CHLORHEXIDINE GLUCONATE 0.12 % MT SOLN: 15.0000 mL | Freq: Once | OROMUCOSAL | Status: AC

## 2021-06-17 MED ORDER — FENTANYL CITRATE (PF) 100 MCG/2ML IJ SOLN
25.0000 ug | INTRAMUSCULAR | Status: DC | PRN
  Administered 2021-06-17: 25 ug via INTRAVENOUS

## 2021-06-17 MED ORDER — BUPIVACAINE-EPINEPHRINE (PF) 0.25% -1:200000 IJ SOLN
INTRAMUSCULAR | Status: AC
  Filled 2021-06-17: qty 30

## 2021-06-17 MED ORDER — LIDOCAINE 2% (20 MG/ML) 5 ML SYRINGE
INTRAMUSCULAR | Status: DC | PRN
  Administered 2021-06-17: 60 mg via INTRAVENOUS

## 2021-06-17 MED ORDER — MIDAZOLAM HCL 2 MG/2ML IJ SOLN
INTRAMUSCULAR | Status: AC
  Filled 2021-06-17: qty 2

## 2021-06-17 MED ORDER — ONDANSETRON HCL 4 MG/2ML IJ SOLN
INTRAMUSCULAR | Status: AC
  Filled 2021-06-17: qty 2

## 2021-06-17 MED ORDER — DEXAMETHASONE SODIUM PHOSPHATE 10 MG/ML IJ SOLN
INTRAMUSCULAR | Status: DC | PRN
  Administered 2021-06-17: 10 mg via INTRAVENOUS

## 2021-06-17 MED ORDER — 0.9 % SODIUM CHLORIDE (POUR BTL) OPTIME
TOPICAL | Status: DC | PRN
  Administered 2021-06-17: 1000 mL

## 2021-06-17 MED ORDER — PROPOFOL 10 MG/ML IV BOLUS
INTRAVENOUS | Status: DC | PRN
  Administered 2021-06-17: 200 mg via INTRAVENOUS

## 2021-06-17 MED ORDER — BUPIVACAINE-EPINEPHRINE 0.25% -1:200000 IJ SOLN
INTRAMUSCULAR | Status: DC | PRN
  Administered 2021-06-17: 16 mL

## 2021-06-17 MED ORDER — SUGAMMADEX SODIUM 200 MG/2ML IV SOLN
INTRAVENOUS | Status: DC | PRN
  Administered 2021-06-17: 200 mg via INTRAVENOUS

## 2021-06-17 MED ORDER — FENTANYL CITRATE (PF) 250 MCG/5ML IJ SOLN
INTRAMUSCULAR | Status: AC
Start: 2021-06-17 — End: ?
  Filled 2021-06-17: qty 5

## 2021-06-17 MED ORDER — SODIUM CHLORIDE 0.9 % IR SOLN
Status: DC | PRN
  Administered 2021-06-17: 1000 mL

## 2021-06-17 MED ORDER — LACTATED RINGERS IV SOLN: INTRAVENOUS | Status: DC

## 2021-06-17 MED ORDER — ACETAMINOPHEN 650 MG RE SUPP: 650.0000 mg | RECTAL | Status: DC | PRN

## 2021-06-17 MED ORDER — FENTANYL CITRATE (PF) 250 MCG/5ML IJ SOLN
INTRAMUSCULAR | Status: DC | PRN
  Administered 2021-06-17 (×3): 50 ug via INTRAVENOUS
  Administered 2021-06-17: 150 ug via INTRAVENOUS
  Administered 2021-06-17: 50 ug via INTRAVENOUS

## 2021-06-17 MED ORDER — ACETAMINOPHEN 325 MG PO TABS: 650.0000 mg | ORAL_TABLET | ORAL | Status: DC | PRN

## 2021-06-17 MED ORDER — ONDANSETRON HCL 4 MG/2ML IJ SOLN
INTRAMUSCULAR | Status: DC | PRN
  Administered 2021-06-17: 4 mg via INTRAVENOUS

## 2021-06-17 MED ORDER — CHLORHEXIDINE GLUCONATE 0.12 % MT SOLN
OROMUCOSAL | Status: AC
  Administered 2021-06-17: 15 mL via OROMUCOSAL
  Filled 2021-06-17: qty 15

## 2021-06-17 MED ORDER — SODIUM CHLORIDE 0.9 % IV SOLN: 250.0000 mL | INTRAVENOUS | Status: DC | PRN

## 2021-06-17 MED ORDER — KETOROLAC TROMETHAMINE 30 MG/ML IJ SOLN
INTRAMUSCULAR | Status: AC
  Filled 2021-06-17: qty 1

## 2021-06-17 MED ORDER — ACETAMINOPHEN 10 MG/ML IV SOLN
1000.0000 mg | Freq: Once | INTRAVENOUS | Status: DC | PRN
  Administered 2021-06-17: 1000 mg via INTRAVENOUS

## 2021-06-17 MED ORDER — DOCUSATE SODIUM 100 MG PO CAPS: 100.0000 mg | ORAL_CAPSULE | Freq: Two times a day (BID) | ORAL | 0 refills | Status: DC

## 2021-06-17 MED ORDER — SODIUM CHLORIDE 0.9% FLUSH: 3.0000 mL | Freq: Two times a day (BID) | INTRAVENOUS | Status: DC

## 2021-06-17 MED ORDER — HYDROMORPHONE HCL 1 MG/ML IJ SOLN
INTRAMUSCULAR | Status: AC
  Filled 2021-06-17: qty 0.5

## 2021-06-17 MED ORDER — PROPOFOL 10 MG/ML IV BOLUS
INTRAVENOUS | Status: AC
Start: 2021-06-17 — End: ?
  Filled 2021-06-17: qty 20

## 2021-06-17 MED ORDER — ROCURONIUM BROMIDE 10 MG/ML (PF) SYRINGE
PREFILLED_SYRINGE | INTRAVENOUS | Status: DC | PRN
  Administered 2021-06-17: 20 mg via INTRAVENOUS
  Administered 2021-06-17: 50 mg via INTRAVENOUS

## 2021-06-17 SURGICAL SUPPLY — 46 items
ADH SKN CLS APL DERMABOND .7 (GAUZE/BANDAGES/DRESSINGS) ×1
APL PRP STRL LF DISP 70% ISPRP (MISCELLANEOUS) ×1
APPLIER CLIP 5 13 M/L LIGAMAX5 (MISCELLANEOUS) ×2
APR CLP MED LRG 5 ANG JAW (MISCELLANEOUS) ×1
BAG COUNTER SPONGE SURGICOUNT (BAG) ×3 IMPLANT
BAG SPEC RTRVL LRG 6X4 10 (ENDOMECHANICALS) ×1
BAG SPNG CNTER NS LX DISP (BAG) ×1
BLADE CLIPPER SURG (BLADE) ×1 IMPLANT
CANISTER SUCT 3000ML PPV (MISCELLANEOUS) ×3 IMPLANT
CHLORAPREP W/TINT 26 (MISCELLANEOUS) ×3 IMPLANT
CLIP APPLIE 5 13 M/L LIGAMAX5 (MISCELLANEOUS) ×2 IMPLANT
COVER SURGICAL LIGHT HANDLE (MISCELLANEOUS) ×3 IMPLANT
DERMABOND ADVANCED (GAUZE/BANDAGES/DRESSINGS) ×1
DERMABOND ADVANCED .7 DNX12 (GAUZE/BANDAGES/DRESSINGS) ×2 IMPLANT
ELECT REM PT RETURN 9FT ADLT (ELECTROSURGICAL) ×2
ELECTRODE REM PT RTRN 9FT ADLT (ELECTROSURGICAL) ×2 IMPLANT
ENDOLOOP SUT PDS II  0 18 (SUTURE) ×2
ENDOLOOP SUT PDS II 0 18 (SUTURE) IMPLANT
GLOVE BIO SURGEON STRL SZ 6 (GLOVE) ×3 IMPLANT
GLOVE BIOGEL PI IND STRL 7.0 (GLOVE) IMPLANT
GLOVE BIOGEL PI IND STRL 7.5 (GLOVE) IMPLANT
GLOVE BIOGEL PI INDICATOR 7.0 (GLOVE) ×2
GLOVE BIOGEL PI INDICATOR 7.5 (GLOVE) ×2
GLOVE INDICATOR 6.5 STRL GRN (GLOVE) ×3 IMPLANT
GOWN STRL REUS W/ TWL LRG LVL3 (GOWN DISPOSABLE) ×6 IMPLANT
GOWN STRL REUS W/TWL LRG LVL3 (GOWN DISPOSABLE) ×6
GRASPER SUT TROCAR 14GX15 (MISCELLANEOUS) ×3 IMPLANT
KIT BASIN OR (CUSTOM PROCEDURE TRAY) ×3 IMPLANT
KIT TURNOVER KIT B (KITS) ×3 IMPLANT
NDL INSUFFLATION 14GA 120MM (NEEDLE) ×2 IMPLANT
NEEDLE INSUFFLATION 14GA 120MM (NEEDLE) ×2 IMPLANT
NS IRRIG 1000ML POUR BTL (IV SOLUTION) ×3 IMPLANT
PAD ARMBOARD 7.5X6 YLW CONV (MISCELLANEOUS) ×3 IMPLANT
POUCH SPECIMEN RETRIEVAL 10MM (ENDOMECHANICALS) ×3 IMPLANT
SCISSORS LAP 5X35 DISP (ENDOMECHANICALS) ×3 IMPLANT
SET IRRIG TUBING LAPAROSCOPIC (IRRIGATION / IRRIGATOR) ×3 IMPLANT
SET TUBE SMOKE EVAC HIGH FLOW (TUBING) ×3 IMPLANT
SLEEVE ENDOPATH XCEL 5M (ENDOMECHANICALS) ×7 IMPLANT
SPECIMEN JAR SMALL (MISCELLANEOUS) ×3 IMPLANT
SUT MNCRL AB 4-0 PS2 18 (SUTURE) ×3 IMPLANT
TOWEL GREEN STERILE (TOWEL DISPOSABLE) IMPLANT
TOWEL GREEN STERILE FF (TOWEL DISPOSABLE) ×3 IMPLANT
TRAY LAPAROSCOPIC MC (CUSTOM PROCEDURE TRAY) ×3 IMPLANT
TROCAR XCEL NON-BLD 11X100MML (ENDOMECHANICALS) ×3 IMPLANT
TROCAR XCEL NON-BLD 5MMX100MML (ENDOMECHANICALS) ×3 IMPLANT
WATER STERILE IRR 1000ML POUR (IV SOLUTION) ×3 IMPLANT

## 2021-06-17 NOTE — Progress Notes (Signed)
MDA notified that after 4 doses of labatelol patients BP still remains in the 600'K systolic.  MDA aware that patient missed her AM dose of lisinopril.  MDA ok with sending the patient home and educating the patient on restarting her home BP medications when she gets home.  Education performed with patient and her husband on pain medications and BP medications.   ?

## 2021-06-17 NOTE — Interval H&P Note (Signed)
History and Physical Interval Note: ? ?06/17/2021 ?9:27 AM ? ?Meghan Lyons  has presented today for surgery, with the diagnosis of acute cholecystitis.  The various methods of treatment have been discussed with the patient and family. After consideration of risks, benefits and other options for treatment, the patient has consented to  Procedure(s): ?LAPAROSCOPIC CHOLECYSTECTOMY (N/A) as a surgical intervention.  The patient's history has been reviewed, patient examined, no change in status, stable for surgery.  I have reviewed the patient's chart and labs.  Questions were answered to the patient's satisfaction.   ? ? ?[name] ? ? ?

## 2021-06-17 NOTE — Anesthesia Postprocedure Evaluation (Signed)
Anesthesia Post Note ? ?Patient: Meghan Lyons ? ?Procedure(s) Performed: LAPAROSCOPIC CHOLECYSTECTOMY ? ?  ? ?Patient location during evaluation: PACU ?Anesthesia Type: General ?Level of consciousness: awake ?Pain management: pain level controlled ?Vital Signs Assessment: post-procedure vital signs reviewed and stable ?Respiratory status: spontaneous breathing, nonlabored ventilation, respiratory function stable and patient connected to nasal cannula oxygen ?Cardiovascular status: blood pressure returned to baseline and stable ?Postop Assessment: no apparent nausea or vomiting ?Anesthetic complications: no ? ? ?No notable events documented. ? ?Last Vitals:  ?Vitals:  ? 06/17/21 1245 06/17/21 1300  ?BP: (!) 209/111 (!) 187/96  ?Pulse: 71 74  ?Resp: 15 19  ?Temp:    ?SpO2: 95% 92%  ?  ?Last Pain:  ?Vitals:  ? 06/17/21 1245  ?TempSrc:   ?PainSc: 4   ? ? ?  ?  ?  ?  ?  ?  ? ?Meghan Lyons ? ? ? ? ?

## 2021-06-17 NOTE — Progress Notes (Signed)
1344 Patients husband called and notified this RN that the pharmacy that they had on file and the pharmacy that the MD called the medications in to is closed.  MD paged and called back immediately and she will try to change the prescriptions over to [facility] on [address] per the patients husband Legrand Como. This RN then called Legrand Como back to let him know that I have been in touch with the MD and notified him that [facility] should [facility] once [facility] is ready.   ?

## 2021-06-17 NOTE — Discharge Instructions (Signed)
LAPAROSCOPIC SURGERY: POST OP INSTRUCTIONS ? ? ?EAT ?Gradually transition to a high fiber diet with a fiber supplement over the next few weeks after discharge.  Start with a pureed / full liquid diet (see below) ? ?WALK ?Walk an hour a day.  Control your pain to do that.   ? ?CONTROL PAIN ?Control pain so that you can walk, sleep, tolerate sneezing/coughing, go up/down stairs. ? ?HAVE A BOWEL MOVEMENT DAILY ?Keep your bowels regular to avoid problems.  OK to try a laxative to override constipation.  OK to use an antidairrheal to slow down diarrhea.  Call if not better after 2 tries ? ?CALL IF YOU HAVE PROBLEMS/CONCERNS ?Call if you are still struggling despite following these instructions. ?Call if you have concerns not answered by these instructions ? ? ? ?DIET: Follow a light bland diet & liquids the first 24 hours after arrival home, such as soup, liquids, starches, etc.  Be sure to drink plenty of fluids.  Quickly advance to a usual solid diet within a few days.  Avoid fast food or heavy meals as your are more likely to get nauseated or have irregular bowels.  A low-sugar, high-fiber diet for the rest of your life is ideal. ? ?Take your usually prescribed home medications unless otherwise directed. ? ?PAIN CONTROL: ?Pain is best controlled by a usual combination of three different methods TOGETHER: ?Ice/Heat ?Over the counter pain medication ?Prescription pain medication ?Most patients will experience some swelling and bruising around the incisions.  Ice packs or heating pads (30-60 minutes up to 6 times a day) will help. Use ice for the first few days to help decrease swelling and bruising, then switch to heat to help relax tight/sore spots and speed recovery.  Some people prefer to use ice alone, heat alone, alternating between ice & heat.  Experiment to what works for you.  Swelling and bruising can take several weeks to resolve.   ?It is helpful to take an over-the-counter pain medication regularly for the  first few days: ?Naproxen (Aleve, etc)  Two '220mg'$  tabs twice a day OR Ibuprofen (Advil, etc) Three '200mg'$  tabs four times a day (every meal & bedtime) AND ?Acetaminophen (Tylenol, etc) 500-'650mg'$  four times a day (every meal & bedtime) ?A  prescription for pain medication (such as oxycodone, hydrocodone, tramadol, gabapentin, methocarbamol, etc) should be given to you upon discharge.  Take your pain medication as prescribed, IF NEEDED.  ?If you are having problems/concerns with the prescription medicine (does not control pain, nausea, vomiting, rash, itching, etc), please call us [phone] to see if we need to switch you to a different pain medicine that will work better for you and/or control your side effect better. ?If you need a refill on your pain medication, please give [place] 48 hour notice.  contact your pharmacy.  They will contact our office to request authorization. Prescriptions will not be filled after 5 pm or on week-ends ? ?Avoid getting constipated.   ?Between the surgery and the pain medications, it is common to experience some constipation.   ?Increasing fluid intake and taking a fiber supplement (such as Metamucil, Citrucel, FiberCon, MiraLax, etc) 1-2 times a day regularly will usually help prevent this problem from occurring.   ?A mild laxative (prune juice, Milk of Magnesia, MiraLax, etc) should be taken according to package directions if there are no bowel movements after 48 hours.   ?Watch out for diarrhea.   ?If you have many loose bowel movements, simplify your diet  to bland foods & liquids for a few days.   ?Stop any stool softeners and decrease your fiber supplement.   ?Switching to mild anti-diarrheal medications (Kayopectate, Pepto Bismol) can help.   ?If this worsens or does not improve, please call us. ? ?Wash / shower every day.  You may shower over the skin glue which is waterproof ? ?Glue will flake off after about 2 weeks.  You may leave the incision open to air.  You may replace a  dressing/Band-Aid to cover the incision for comfort if you wish.  ? ?ACTIVITIES as tolerated:   ?You may resume regular (light) daily activities beginning the next day--such as daily self-care, walking, climbing stairs--gradually increasing activities as tolerated.  If you can walk 30 minutes without difficulty, it is safe to try more intense activity such as jogging, treadmill, bicycling, low-impact aerobics, swimming, etc. ?Save the most intensive and strenuous activity for last such as sit-ups, heavy lifting, contact sports, etc  Refrain from any heavy lifting or straining until you are off narcotics for pain control.   ?DO NOT PUSH THROUGH PAIN.  Let pain be your guide: If it hurts to do something, don't do it.  Pain is your body warning you to avoid that activity for another week until the pain goes down. ?You may drive when you are no longer taking prescription pain medication, you can comfortably wear a seatbelt, and you can safely maneuver your car and apply brakes. ?You may have sexual intercourse when it is comfortable. ? ?FOLLOW UP in our office ?Please call [facility] at [phone] to set up an appointment to see your surgeon in the office for a follow-up appointment approximately 2-3 weeks after your surgery. ?Make sure that you call for this appointment the day you arrive home to insure a convenient appointment time. ? ?10. IF YOU HAVE DISABILITY OR FAMILY LEAVE FORMS, BRING THEM TO THE OFFICE FOR PROCESSING.  DO NOT GIVE THEM TO YOUR DOCTOR. ? ? ?WHEN TO CALL us [phone]: ?Poor pain control ?Reactions / problems with new medications (rash/itching, nausea, etc)  ?Fever over 101.5 F (38.5 C) ?Inability to urinate ?Nausea and/or vomiting ?Worsening swelling or bruising ?Continued bleeding from incision. ?Increased pain, redness, or drainage from the incision ? ? The clinic staff is available to answer your questions during regular business hours (8:30am-5pm).  Please don?t hesitate to call and ask to  speak to one of our nurses for clinical concerns.  ? If you have a medical emergency, go to the nearest emergency room or call 911. ? A surgeon from [facility] is always on call at the hospitals ? ? ?[facility], [place] ?[address] [place], [place],   [address] ? ?MAIN: [phone] ? TOLL FREE: [phone] ?  ?FAX [phone] ?[email or web address] ? ?

## 2021-06-17 NOTE — Anesthesia Preprocedure Evaluation (Addendum)
Anesthesia Evaluation  ?Patient identified by MRN, date of birth, ID band ?Patient awake ? ? ? ?Reviewed: ?Allergy & Precautions, NPO status , Patient's Chart, lab work & pertinent test results ? ?Airway ?Mallampati: III ? ?TM Distance: >3 FB ?Neck ROM: Full ? ? ? Dental ?no notable dental hx. ? ?  ?Pulmonary ?neg pulmonary ROS,  ?  ?Pulmonary exam normal ? ? ? ? ? ? ? Cardiovascular ?hypertension, Pt. on medications ?Normal cardiovascular exam ? ? ?  ?Neuro/Psych ? Neuromuscular disease negative psych ROS  ? GI/Hepatic ?negative GI ROS, Neg liver ROS,   ?Endo/Other  ?negative endocrine ROS ? Renal/GU ?negative Renal ROS  ? ?  ?Musculoskeletal ? ?(+) Arthritis ,  ? Abdominal ?(+) + obese,   ?Peds ? Hematology ?negative hematology ROS ?(+)   ?Anesthesia Other Findings ?acute cholecystitis ? Reproductive/Obstetrics ? ?  ? ? ? ? ? ? ? ? ? ? ? ? ? ?  ?  ? ? ? ? ? ? ? ?Anesthesia Physical ?Anesthesia Plan ? ?ASA: 2 ? ?Anesthesia Plan: General  ? ?Post-op Pain Management:   ? ?Induction: Intravenous ? ?PONV Risk Score and Plan: 4 or greater and Scopolamine patch - Pre-op, Midazolam, Ondansetron, Dexamethasone and Treatment may vary due to age or medical condition ? ?Airway Management Planned: Oral ETT ? ?Additional Equipment:  ? ?Intra-op Plan:  ? ?Post-operative Plan: Extubation in OR ? ?Informed Consent: I have reviewed the patients History and Physical, chart, labs and discussed the procedure including the risks, benefits and alternatives for the proposed anesthesia with the patient or authorized representative who has indicated his/her understanding and acceptance.  ? ? ? ?Dental advisory given ? ?Plan Discussed with: CRNA ? ?Anesthesia Plan Comments:   ? ? ? ? ? ?Anesthesia Quick Evaluation ? ?

## 2021-06-17 NOTE — Op Note (Signed)
Operative Note ? ?Meghan Lyons 56 y.o. female ?[id number]  ?06/17/2021 ? ?Surgeon: Clovis Riley MD FACS ? ?Procedure performed: Laparoscopic Cholecystectomy ? ?Procedure classification: emergent ? ?Preop diagnosis: acute cholecysitits ?Post-op diagnosis/intraop findings: same ? ?Specimens: gallbladder ? ?Retained items: none ? ?EBL: minimal ? ?Complications: none ? ?Description of procedure: After obtaining informed consent the patient was brought to the operating room. Antibiotics were administered. SCD's were applied. General endotracheal anesthesia was initiated and a formal time-out was performed. The abdomen was prepped and draped in the usual sterile fashion and the abdomen was entered using an infraumbilical veress needle after instilling the site with local. Insufflation to 534mHg was obtained, 572mtrocar and camera inserted, and gross inspection revealed no evidence of injury from our entry or other intraabdominal abnormalities. Two 34m30mrocars were introduced in the right midclavicular and right anterior axillary lines under direct visualization and following infiltration with local. An 91m37mocar was placed in the epigastrium. The gallbladder was retracted cephalad and the infundibulum was retracted laterally. A combination of hook electrocautery and blunt dissection was utilized to clear the peritoneum from the neck and cystic duct, circumferentially isolating the cystic artery and cystic duct and lifting the gallbladder from the cystic plate. The critical view of safety was achieved with the cystic artery, cystic duct, and liver bed visualized between them with no other structures. The artery was clipped with a single clip proximally and distally and divided. A small posterior branch was additionally clipped before being divided distally with cuatery. The cystic duct contained an impacted stone which could not be milked back into the gallbladder, and the duct was slightly too wide for the  clip. The gallbladder was dissected from the liver plate using electrocautery until the only structure connecting the gallbladder to the patient was the cystic duct. A 0 PDS endoloop was placed just proximal to the impacted stone on the cystic duct and secured. An additional endoloop was placed at the cystic duct-gallbladder junction and cystic duct was divided with scissors. The impacted stone was then able to be milked out of the remnant cystic duct. This and the gallbladder were placed in an endocatch bag and removed intact. Two clips were placed on the now decompressed cystic duct, just distal to the previously placed endoloop.  The right upper quadrant was irrigated and aspirated; the effluent was clear. Hemostasis was once again confirmed, and reinspection of the abdomen revealed no injuries. The clips and endoloop were well apposed without any bile leak from the duct or the liver bed. The 91mm79mcar site in the epigastrium was closed with a 0 vicryl in the fascia under direct visualization using a PMI device. The abdomen was desufflated and all trocars removed. The skin incisions were closed with subcuticular 4-0 monocryl and Dermabond. The patient was awakened, extubated and transported to the recovery room in stable condition.  ?  ?All counts were correct at the completion of the case. ? ? ?  ?

## 2021-06-17 NOTE — Transfer of Care (Signed)
Immediate Anesthesia Transfer of Care Note ? ?Patient: Meghan Lyons ? ?Procedure(s) Performed: LAPAROSCOPIC CHOLECYSTECTOMY ? ?Patient Location: PACU ? ?Anesthesia Type:General ? ?Level of Consciousness: drowsy and patient cooperative ? ?Airway & Oxygen Therapy: patient spontaneously breathing and connected to [place] O2 ? ?Post-op Assessment: Report given to RN and Post -op Vital signs reviewed and stable ? ?Post vital signs: Reviewed and stable ? ?Last Vitals:  ?Vitals Value Taken Time  ?BP 113/58 06/17/21 1124  ?Temp    ?Pulse 83 06/17/21 1125  ?Resp 9 06/17/21 1125  ?SpO2 91 % 06/17/21 1125  ?Vitals shown include unvalidated device data. ? ?Last Pain:  ?Vitals:  ? 06/17/21 0942  ?TempSrc:   ?PainSc: 0-No pain  ?   ? ?Patients Stated Pain Goal: 3 (06/17/21 7276) ? ?Complications: No notable events documented. ?

## 2021-06-17 NOTE — Anesthesia Procedure Notes (Signed)
Procedure Name: Intubation ?Date/Time: 06/17/2021 10:13 AM ?Performed by: Colin Benton, CRNA ?Pre-anesthesia Checklist: Patient identified, Emergency Drugs available, Suction available and Patient being monitored ?Patient Re-evaluated:Patient Re-evaluated prior to induction ?Oxygen Delivery Method: Circle system utilized ?Preoxygenation: Pre-oxygenation with 100% oxygen ?Induction Type: IV induction ?Ventilation: Mask ventilation without difficulty ?Laryngoscope Size: Mac and 3 ?Grade View: Grade I ?Tube type: Oral ?Tube size: 7.0 mm ?Number of attempts: 1 ?Airway Equipment and Method: Stylet ?Placement Confirmation: ETT inserted through vocal cords under direct vision, positive ETCO2 and breath sounds checked- equal and bilateral ?Secured at: 22 cm ?Tube secured with: Tape ?Dental Injury: Teeth and Oropharynx as per pre-operative assessment  ? ? ? ? ?

## 2021-06-18 ENCOUNTER — Other Ambulatory Visit: Payer: Self-pay

## 2021-06-18 ENCOUNTER — Encounter (HOSPITAL_COMMUNITY): Payer: Self-pay | Admitting: Surgery

## 2021-06-20 ENCOUNTER — Other Ambulatory Visit: Payer: Self-pay | Admitting: Obstetrics and Gynecology

## 2021-06-20 DIAGNOSIS — Z1231 Encounter for screening mammogram for malignant neoplasm of breast: Secondary | ICD-10-CM

## 2021-06-20 LAB — SURGICAL PATHOLOGY

## 2021-08-22 ENCOUNTER — Ambulatory Visit
Admission: RE | Admit: 2021-08-22 | Discharge: 2021-08-22 | Disposition: A | Discharge status: Home / Self Care | Payer: Commercial Managed Care - HMO | Source: Ambulatory Visit | Attending: Obstetrics and Gynecology | Admitting: Obstetrics and Gynecology

## 2021-08-22 DIAGNOSIS — Z1231 Encounter for screening mammogram for malignant neoplasm of breast: Secondary | ICD-10-CM

## 2022-01-11 ENCOUNTER — Other Ambulatory Visit: Payer: Self-pay | Admitting: Obstetrics and Gynecology

## 2022-03-18 ENCOUNTER — Ambulatory Visit
Admission: EM | Admit: 2022-03-18 | Discharge: 2022-03-18 | Disposition: A | Discharge status: Home / Self Care | Payer: Commercial Managed Care - HMO | Attending: Emergency Medicine | Admitting: Emergency Medicine

## 2022-03-18 DIAGNOSIS — J0140 Acute pansinusitis, unspecified: Secondary | ICD-10-CM

## 2022-03-18 DIAGNOSIS — H109 Unspecified conjunctivitis: Secondary | ICD-10-CM | POA: Diagnosis not present

## 2022-03-18 MED ORDER — POLYMYXIN B-TRIMETHOPRIM 10000-0.1 UNIT/ML-% OP SOLN: 1.0000 [drp] | OPHTHALMIC | 0 refills | Status: DC

## 2022-03-18 MED ORDER — AZITHROMYCIN 250 MG PO TABS: 250.0000 mg | ORAL_TABLET | Freq: Every day | ORAL | 0 refills | Status: DC

## 2022-03-18 MED ORDER — IPRATROPIUM BROMIDE 0.03 % NA SOLN
2.0000 | Freq: Two times a day (BID) | NASAL | 12 refills | Status: DC
Start: 2022-03-18 — End: 2022-06-19

## 2022-03-18 NOTE — ED Triage Notes (Signed)
Pt presents with sinus pain, congestion, non productive cough with right eye drainage X 1 week .

## 2022-03-18 NOTE — ED Provider Notes (Signed)
[facility]    CSN: [id number] Arrival date & time: 03/18/22  3382      History   Chief Complaint Chief Complaint  Patient presents with   URI   Eye Drainage    HPI Meghan Lyons is a 57 y.o. female.    Patient presents for evaluation of fever, chills, nasal congestion, bilateral ear pain and fullness, rhinorrhea, sinus pain and pressure and a nonproductive cough present for 7 days.  Decreased appetite due to altered taste and smell but has seen some improvement in symptoms.  Began to experience left-sided eye discharge and pruritus beginning 1 day ago.  Has not attempted treatment.  No sick contacts prior.  Has attempted use of Mucinex, pseudoephedrine, DayQuil and NyQuil which have been minimally helpful.  Denies respiratory history.  Non-smoker.    Past Medical History:  Diagnosis Date   DDD (degenerative disc disease), lumbar 11/2008   Diabetes mellitus 03/2010   Dr. Chalmers Cater   Elevated alkaline phosphatase level    Fatty liver 12/11   bx revealed--granuloma   Hyperlipidemia 07/2010   Hypertension    Hypertriglyceridemia    Polymyalgia ([place])    Vitamin D deficiency disease 2009    Patient Active Problem List   Diagnosis Date Noted   Acute cholecystitis 06/16/2021   Menorrhagia 08/16/2012    Past Surgical History:  Procedure Laterality Date   CESAREAN SECTION  10/89 & 5/96   CHOLECYSTECTOMY N/A 06/17/2021   Procedure: LAPAROSCOPIC CHOLECYSTECTOMY;  Surgeon: Clovis Riley, MD;  Location: [place];  Service: General;  Laterality: N/A;   LUMBAR SPINE SURGERY  11/19/2012   L4-L5 fusion   WISDOM TOOTH EXTRACTION  age 24    OB History     Gravida  2   Para  2   Term  2   Preterm  0   AB  0   Living  2      SAB  0   IAB  0   Ectopic  0   Multiple  0   Live Births  2            Home Medications    Prior to Admission medications   Medication Sig Start Date End Date Taking? Authorizing Provider  Biotin 800 MCG TABS  Take 800 mcg by mouth daily.    [provider]  calcium carbonate (OS-CAL) 600 MG TABS Take 600 mg by mouth 2 (two) times daily with a meal.    [provider]  Cyanocobalamin (B-12) 5000 MCG CAPS Take 5,000 mcg by mouth daily.    [provider]  fluconazole (DIFLUCAN) 150 MG tablet Take 150 mg by mouth every 3 (three) days. 06/13/21   [provider]  lisinopril-hydrochlorothiazide (PRINZIDE,ZESTORETIC) 20-12.5 MG tablet Take 2 tablets by mouth daily.     [provider]  meloxicam (MOBIC) 15 MG tablet Take 15 mg by mouth daily.    [provider]  Multiple Vitamin (MULTIVITAMIN) tablet Take 1 tablet by mouth daily.    [provider]  norethindrone (MICRONOR) 0.35 MG tablet Take 1 tablet (0.35 mg total) by mouth daily. Stop taking pills 2 weeks prior to appointment. Patient not taking: Reported on 06/17/2021 06/15/21   Nunzio Cobbs, MD  Omega-3 Fatty Acids (FISH OIL) 1200 MG CAPS Take 1 capsule by mouth 3 (three) times daily.    [provider]  potassium chloride SA (KLOR-CON) 20 MEQ tablet Take 1 tablet by mouth 2 (two)  times daily.    [provider]  rosuvastatin (CRESTOR) 5 MG tablet Take 5 mg by mouth every other day. 05/05/21   [provider]  thiamine 100 MG tablet Take 100 mg by mouth 2 (two) times daily.    [provider]  traMADol (ULTRAM) 50 MG tablet Take 50 mg by mouth 2 (two) times daily.    [provider]  Vitamin D, Ergocalciferol, 50 MCG (2000 UT) CAPS Take 50 mcg by mouth daily.    [provider]  norethindrone (AYGESTIN) 5 MG tablet Take 1 tablet (5 mg total) by mouth as directed. Take as directed 06/10/12 06/11/12  Kem Boroughs, FNP    Family History Family History  Problem Relation Age of Onset   Thyroid disease Mother    Hypertension Mother    Diabetes Maternal Grandmother    Coronary artery disease Maternal Grandmother    Coronary  artery disease Father    Hypertension Sister    Coronary artery disease Maternal Grandfather    Rheum arthritis Paternal Grandmother     Social History Social History   Tobacco Use   Smoking status: Never   Smokeless tobacco: Never  Vaping Use   Vaping Use: Never used  Substance Use Topics   Alcohol use: No    Comment: rare   Drug use: No     Allergies   Cephalexin, Phentermine-topiramate, and Cephalexin   Review of Systems Review of Systems  Constitutional:  Positive for chills and fever. Negative for activity change, appetite change, diaphoresis, fatigue and unexpected weight change.  HENT:  Positive for congestion, ear pain, rhinorrhea, sinus pressure and sinus pain. Negative for dental problem, drooling, ear discharge, facial swelling, hearing loss, mouth sores, nosebleeds, postnasal drip, sneezing, sore throat, tinnitus, trouble swallowing and voice change.   Eyes:  Positive for discharge and itching. Negative for photophobia, pain, redness and visual disturbance.  Respiratory:  Positive for cough. Negative for apnea, choking, chest tightness, shortness of breath, wheezing and stridor.   Cardiovascular: Negative.   Gastrointestinal: Negative.   Skin: Negative.      Physical Exam Triage Vital Signs ED Triage Vitals [03/18/22 1023]  Enc Vitals Group     BP (!) 166/106     Pulse Rate 96     Resp 17     Temp (!) 100.8 F (38.2 C)     Temp Source Oral     SpO2 98 %     Weight      Height      Head Circumference      Peak Flow      Pain Score 6     Pain Loc      Pain Edu?      Excl. in [place]?    No data found.  Updated Vital Signs BP (!) 166/106 (BP Location: Left Arm)   Pulse 96   Temp (!) 100.8 F (38.2 C) (Oral)   Resp 17   SpO2 98%   Visual Acuity Right Eye Distance:   Left Eye Distance:   Bilateral Distance:    Right Eye Near:   Left Eye Near:    Bilateral Near:     Physical Exam Constitutional:      Appearance: Normal appearance.  HENT:      Head: Normocephalic.     Right Ear: Tympanic membrane, ear canal and external ear normal.     Left Ear: Tympanic membrane, ear canal and external ear normal.     Nose: Congestion  and rhinorrhea present.  Eyes:     Comments: Erythema present to the left conjunctiva, no drainage noted on exam, vision is grossly intact, extraocular movements intact  Cardiovascular:     Rate and Rhythm: Normal rate and regular rhythm.     Pulses: Normal pulses.     Heart sounds: Normal heart sounds.  Pulmonary:     Effort: Pulmonary effort is normal.     Breath sounds: Normal breath sounds.  Neurological:     Mental Status: She is alert and oriented to person, place, and time. Mental status is at baseline.  Psychiatric:        Mood and Affect: Mood normal.        Behavior: Behavior normal.      UC Treatments / Results  Labs (all labs ordered are listed, but only abnormal results are displayed) Labs Reviewed - No data to display  EKG   Radiology No results found.  Procedures Procedures (including critical care time)  Medications Ordered in UC Medications - No data to display  Initial Impression / Assessment and Plan / UC Course  I have reviewed the triage vital signs and the nursing notes.  Pertinent labs & imaging results that were available during my care of the patient were reviewed by me and considered in my medical decision making (see chart for details).  Acute nonrecurrent pansinusitis, bacterial conjunctivitis of right eye  Fever 100.8 noted in triage, patient is in no signs of distress nontoxic-appearing, presentation is consistent with a sinusitis as well as eyes consistent with a conjunctivitis, discussed with patient, prescribed Polytrim and azithromycin as well as ipratropium nasal spray for additional support, advised against eye touching or rubbing, may use oral antihistamines and analgesics for management of pain and itching, may continue use of over-the-counter  medications for support, may follow-up with his urgent care as needed Final Clinical Impressions(s) / UC Diagnoses   Final diagnoses:  None   Discharge Instructions   None    ED Prescriptions   None    PDMP not reviewed this encounter.   Hans Eden, [place] 03/21/22 [phone]

## 2022-03-18 NOTE — Discharge Instructions (Signed)
today you are being treated for sinus infection and pinkeye  Begin azithromycin as directed to provide coverage for bacteria  May use nasal spray every morning and every evening to further help clear sinuses, may continue over-the-counter medication in addition to this  Place 1 drop into the right eye every 4 hours for the next 7 days to help clear germs from the eye, may use in left eye if you begin to have symptoms  Avoid direct eye touching or rubbing to prevent further contamination and spread    You can take Tylenol and/or Ibuprofen as needed for fever reduction and pain relief.   For cough: honey 1/2 to 1 teaspoon (you can dilute the honey in water or another fluid).  You can also use guaifenesin and dextromethorphan for cough. You can use a humidifier for chest congestion and cough.  If you don't have a humidifier, you can sit in the bathroom with the hot shower running.      For sore throat: try warm salt water gargles, cepacol lozenges, throat spray, warm tea or water with lemon/honey, popsicles or ice, or OTC cold relief medicine for throat discomfort.   For congestion: take a daily anti-histamine like Zyrtec, Claritin, and a oral decongestant, such as pseudoephedrine.  You can also use Flonase 1-2 sprays in each nostril daily.   It is important to stay hydrated: drink plenty of fluids (water, gatorade/powerade/pedialyte, juices, or teas) to keep your throat moisturized and help further relieve irritation/discomfort.

## 2022-04-25 ENCOUNTER — Ambulatory Visit
Admission: EM | Admit: 2022-04-25 | Discharge: 2022-04-25 | Disposition: A | Discharge status: Home / Self Care | Payer: Commercial Managed Care - HMO

## 2022-04-25 DIAGNOSIS — R110 Nausea: Secondary | ICD-10-CM | POA: Diagnosis not present

## 2022-04-25 DIAGNOSIS — R0789 Other chest pain: Secondary | ICD-10-CM

## 2022-04-25 NOTE — ED Triage Notes (Signed)
Pt c/o chest tightness, sweats, nausea,   Onset ~ 3-4 days ago

## 2022-04-25 NOTE — ED Notes (Signed)
Patient is being discharged from the Urgent Care and sent to the Emergency Department via self . Per Hildred Alamin, patient is in need of higher level of care due to CP. Patient is aware and verbalizes understanding of plan of care.  Vitals:   04/25/22 0931 04/25/22 0933  BP:  (!) 155/99  Pulse: 76   Resp: 16   Temp: 98 F (36.7 C)   SpO2: 98%

## 2022-04-25 NOTE — Discharge Instructions (Signed)
Please go straight to [facility] for further evaluation and management.

## 2022-04-25 NOTE — ED Provider Notes (Signed)
[facility]    CSN: [id number] Arrival date & time: 04/25/22  0845      History   Chief Complaint Chief Complaint  Patient presents with   Chest Pain    HPI Meghan Lyons is a 57 y.o. female.   Patient presents with chest pain, nausea without vomiting, sweats that started about 3 to 4 days ago.  Patient describes the chest pain as "someone ripping her chest apart".  Reports intermittent shortness of breath as well.  Symptoms have been intermittent.  Denies any associated diarrhea, cough, fever.  Denies history of cardiac problems.  Has not taken any over-the-counter medications for symptoms.  Denies any aggravating or relieving factors.  Reports lightheadedness as well but denies blurry vision or headache.   Chest Pain   Past Medical History:  Diagnosis Date   DDD (degenerative disc disease), lumbar 11/2008   Diabetes mellitus 03/2010   Dr. Chalmers Cater   Elevated alkaline phosphatase level    Fatty liver 12/11   bx revealed--granuloma   Hyperlipidemia 07/2010   Hypertension    Hypertriglyceridemia    Polymyalgia ([place])    Vitamin D deficiency disease 2009    Patient Active Problem List   Diagnosis Date Noted   Acute cholecystitis 06/16/2021   Menorrhagia 08/16/2012    Past Surgical History:  Procedure Laterality Date   CESAREAN SECTION  10/89 & 5/96   CHOLECYSTECTOMY N/A 06/17/2021   Procedure: LAPAROSCOPIC CHOLECYSTECTOMY;  Surgeon: Clovis Riley, MD;  Location: [place];  Service: General;  Laterality: N/A;   LUMBAR SPINE SURGERY  11/19/2012   L4-L5 fusion   WISDOM TOOTH EXTRACTION  age 48    OB History     Gravida  2   Para  2   Term  2   Preterm  0   AB  0   Living  2      SAB  0   IAB  0   Ectopic  0   Multiple  0   Live Births  2            Home Medications    Prior to Admission medications   Medication Sig Start Date End Date Taking? Authorizing Provider  azithromycin (ZITHROMAX) 250 MG tablet Take 1  tablet (250 mg total) by mouth daily. Take first 2 tablets together, then 1 every day until finished. 03/18/22   Hans Eden, NP  Biotin 800 MCG TABS Take 800 mcg by mouth daily.    [provider]  calcium carbonate (OS-CAL) 600 MG TABS Take 600 mg by mouth 2 (two) times daily with a meal.    [provider]  Cyanocobalamin (B-12) 5000 MCG CAPS Take 5,000 mcg by mouth daily.    [provider]  fluconazole (DIFLUCAN) 150 MG tablet Take 150 mg by mouth every 3 (three) days. 06/13/21   [provider]  ipratropium (ATROVENT) 0.03 % nasal spray Place 2 sprays into both nostrils every 12 (twelve) hours. 03/18/22   Hans Eden, NP  lisinopril-hydrochlorothiazide (PRINZIDE,ZESTORETIC) 20-12.5 MG tablet Take 2 tablets by mouth daily.     [provider]  meloxicam (MOBIC) 15 MG tablet Take 15 mg by mouth daily.    [provider]  Meloxicam 5 MG CAPS Meloxicam    [provider]  Multiple Vitamin (MULTIVITAMIN) tablet Take 1 tablet by mouth daily.    [provider]  norethindrone (MICRONOR) 0.35 MG tablet Take 1 tablet (0.35 mg total) by mouth  daily. Stop taking pills 2 weeks prior to appointment. Patient not taking: Reported on 06/17/2021 06/15/21   Nunzio Cobbs, MD  Omega-3 Fatty Acids (FISH OIL) 1200 MG CAPS Take 1 capsule by mouth 3 (three) times daily.    [provider]  potassium chloride SA (KLOR-CON) 20 MEQ tablet Take 1 tablet by mouth 2 (two) times daily.    [provider]  rosuvastatin (CRESTOR) 5 MG tablet Take 5 mg by mouth every other day. 05/05/21   [provider]  thiamine 100 MG tablet Take 100 mg by mouth 2 (two) times daily.    [provider]  traMADol (ULTRAM) 50 MG tablet Take 50 mg by mouth 2 (two) times daily.    [provider]  trimethoprim-polymyxin b (POLYTRIM) ophthalmic solution Place 1 drop into the right eye every 4 (four) hours.  03/18/22   Hans Eden, NP  Vitamin D, Ergocalciferol, 50 MCG (2000 UT) CAPS Take 50 mcg by mouth daily.    [provider]  norethindrone (AYGESTIN) 5 MG tablet Take 1 tablet (5 mg total) by mouth as directed. Take as directed 06/10/12 06/11/12  Kem Boroughs, FNP    Family History Family History  Problem Relation Age of Onset   Thyroid disease Mother    Hypertension Mother    Diabetes Maternal Grandmother    Coronary artery disease Maternal Grandmother    Coronary artery disease Father    Hypertension Sister    Coronary artery disease Maternal Grandfather    Rheum arthritis Paternal Grandmother     Social History Social History   Tobacco Use   Smoking status: Never   Smokeless tobacco: Never  Vaping Use   Vaping Use: Never used  Substance Use Topics   Alcohol use: No    Comment: rare   Drug use: No     Allergies   Cephalexin, Phentermine-topiramate, and Cephalexin   Review of Systems Review of Systems Per HPI  Physical Exam Triage Vital Signs ED Triage Vitals  Enc Vitals Group     BP 04/25/22 0933 (!) 155/99     Pulse Rate 04/25/22 0931 76     Resp 04/25/22 0931 16     Temp 04/25/22 0931 98 F (36.7 C)     Temp Source 04/25/22 0931 Oral     SpO2 04/25/22 0931 98 %     Weight --      Height --      Head Circumference --      Peak Flow --      Pain Score 04/25/22 0931 5     Pain Loc --      Pain Edu? --      Excl. in [place]? --    No data found.  Updated Vital Signs BP (!) 155/99 (BP Location: Right Arm)   Pulse 76   Temp 98 F (36.7 C) (Oral)   Resp 16   SpO2 98%   Visual Acuity Right Eye Distance:   Left Eye Distance:   Bilateral Distance:    Right Eye Near:   Left Eye Near:    Bilateral Near:     Physical Exam Constitutional:      General: She is not in acute distress.    Appearance: Normal appearance. She is not toxic-appearing or diaphoretic.     Comments: Patient appears very uncomfortable.   HENT:     Head:  Normocephalic and atraumatic.  Eyes:     Extraocular Movements: Extraocular movements  intact.     Conjunctiva/sclera: Conjunctivae normal.     Pupils: Pupils are equal, round, and reactive to light.  Cardiovascular:     Rate and Rhythm: Normal rate and regular rhythm.     Pulses: Normal pulses.     Heart sounds: Normal heart sounds.  Pulmonary:     Effort: Pulmonary effort is normal. No respiratory distress.     Breath sounds: Normal breath sounds. No stridor. No wheezing, rhonchi or rales.  Neurological:     General: No focal deficit present.     Mental Status: She is alert and oriented to person, place, and time. Mental status is at baseline.  Psychiatric:        Mood and Affect: Mood normal.        Behavior: Behavior normal.        Thought Content: Thought content normal.        Judgment: Judgment normal.      UC Treatments / Results  Labs (all labs ordered are listed, but only abnormal results are displayed) Labs Reviewed - No data to display  EKG   Radiology No results found.  Procedures Procedures (including critical care time)  Medications Ordered in UC Medications - No data to display  Initial Impression / Assessment and Plan / UC Course  I have reviewed the triage vital signs and the nursing notes.  Pertinent labs & imaging results that were available during my care of the patient were reviewed by me and considered in my medical decision making (see chart for details).     EKG was normal sinus rhythm which is reassuring.  Although, on physical exam patient appears to be very uncomfortable due to chest pain.  With associated symptoms, I am concerned for cardiac etiology and I do think that further workup is necessary.  Do not have these resources here in urgent care so patient was advised to go to the ER for further evaluation.  Suggested EMS transport but patient declined wishing for her husband to transport her.  Risks associated with not going by EMS were  discussed with patient.  Patient voiced understanding and accepted risks.  She wished for her husband to transport her to the ER. Final Clinical Impressions(s) / UC Diagnoses   Final diagnoses:  Other chest pain  Nausea without vomiting     Discharge Instructions      Please go straight to [facility] for further evaluation and management.    ED Prescriptions   None    PDMP not reviewed this encounter.   Teodora Medici, [place] 04/25/22 [phone]

## 2022-06-05 NOTE — Progress Notes (Signed)
57 y.o. [id number] Married Caucasian female here for annual exam.    Regular menses.  She is still taking her birth control pills.  Occasional hot flash.   Enjoying her grandchildren.   PCP:   Windle GuardWilson Elkins, MD Endocrinology:  Dr. Horald PollenBalen  Patient's last menstrual period was 05/28/2022.     Period Cycle (Days): 30 Period Duration (Days): 4-5 Period Pattern: Regular Menstrual Flow: Moderate, Light Menstrual Control: Thin pad Dysmenorrhea: (!) Mild     Sexually active: Yes.    The current method of family planning is POP (progesterone only).    Exercising: Yes.     Walking, busy daily life Smoker:  no  Health Maintenance: Pap:  04/05/17 neg: HR HPV neg, 03/01/14  neg History of abnormal Pap:  no MMG:  08/22/21 Breast Density Category B, BI-RADS CAT 1 neg Colonoscopy:  06/09/19 - due in 10 years.  BMD:   2006  Result  normal TDaP:  02/27/11 Gardasil:   no HIV: neg in preg Hep C: neg in preg Screening Labs:  PCP   reports that she has never smoked. She has never used smokeless tobacco. She reports that she does not drink alcohol and does not use drugs.  Past Medical History:  Diagnosis Date   DDD (degenerative disc disease), lumbar 11/2008   Diabetes mellitus 03/2010   Dr. Talmage NapBalan   Elevated alkaline phosphatase level    Fatty liver 12/11   bx revealed--granuloma   Hyperlipidemia 07/2010   Hypertension    Hypertriglyceridemia    Polymyalgia    Vitamin D deficiency disease 2009    Past Surgical History:  Procedure Laterality Date   CESAREAN SECTION  10/89 & 5/96   CHOLECYSTECTOMY N/A 06/17/2021   Procedure: LAPAROSCOPIC CHOLECYSTECTOMY;  Surgeon: Berna Bueonnor, Chelsea A, MD;  Location: [facility];  Service: General;  Laterality: N/A;   LUMBAR SPINE SURGERY  11/19/2012   L4-L5 fusion   WISDOM TOOTH EXTRACTION  age 89    Current Outpatient Medications  Medication Sig Dispense Refill   Biotin 800 MCG TABS Take 800 mcg by mouth daily.     calcium carbonate (OS-CAL) 600 MG TABS Take 600 mg  by mouth 2 (two) times daily with a meal.     Cyanocobalamin (B-12) 5000 MCG CAPS Take 5,000 mcg by mouth daily.     lisinopril-hydrochlorothiazide (PRINZIDE,ZESTORETIC) 20-12.5 MG tablet Take 2 tablets by mouth daily.      meloxicam (MOBIC) 15 MG tablet Take 15 mg by mouth daily.     Multiple Vitamin (MULTIVITAMIN) tablet Take 1 tablet by mouth daily.     norethindrone (MICRONOR) 0.35 MG tablet Take 1 tablet (0.35 mg total) by mouth daily. Stop taking pills 2 weeks prior to appointment. 84 tablet 3   Omega-3 Fatty Acids (FISH OIL) 1200 MG CAPS Take 1 capsule by mouth 3 (three) times daily.     potassium chloride SA (KLOR-CON) 20 MEQ tablet Take 1 tablet by mouth 2 (two) times daily.     rosuvastatin (CRESTOR) 5 MG tablet Take 5 mg by mouth every other day.     traMADol (ULTRAM) 50 MG tablet Take 50 mg by mouth 2 (two) times daily.     Vitamin D, Ergocalciferol, 50 MCG (2000 UT) CAPS Take 50 mcg by mouth daily.     No current facility-administered medications for this visit.    Family History  Problem Relation Age of Onset   Thyroid disease Mother    Hypertension Mother    Diabetes Maternal Grandmother  Coronary artery disease Maternal Grandmother    Coronary artery disease Father    Hypertension Sister    Coronary artery disease Maternal Grandfather    Rheum arthritis Paternal Grandmother     Review of Systems  All other systems reviewed and are negative.   Exam:   BP 134/88 (BP Location: Right Arm, Patient Position: Sitting, Cuff Size: Large)   Pulse 76   Ht 5' 5.5" (1.664 m)   Wt 205 lb (93 kg)   LMP 05/28/2022   SpO2 98%   BMI 33.59 kg/m     General appearance: alert, cooperative and appears stated age Head: normocephalic, without obvious abnormality, atraumatic Neck: no adenopathy, supple, symmetrical, trachea midline and thyroid normal to inspection and palpation Lungs: clear to auscultation bilaterally Breasts: normal appearance, no masses or tenderness, No nipple  retraction or dimpling, No nipple discharge or bleeding, No axillary adenopathy Heart: regular rate and rhythm Abdomen: soft, non-tender; no masses, no organomegaly Extremities: extremities normal, atraumatic, no cyanosis or edema Skin: skin color, texture, turgor normal. No rashes or lesions Lymph nodes: cervical, supraclavicular, and axillary nodes normal. Neurologic: grossly normal  Pelvic: External genitalia:  no lesions              No abnormal inguinal nodes palpated.              Urethra:  normal appearing urethra with no masses, tenderness or lesions              Bartholins and Skenes: normal                 Vagina: normal appearing vagina with normal color and discharge, no lesions              Cervix: no lesions              Pap taken: yes Bimanual Exam:  Uterus:  normal size, contour, position, consistency, mobility, non-tender              Adnexa: no mass, fullness, tenderness              Rectal exam: yes.  Confirms.              Anus:  normal sphincter tone, no lesions  Chaperone was present for exam:  Warren Lacy, CMA  Assessment:   Well woman visit with gynecologic exam. On Micronor.    Enlarged thyroid with left thyroid nodule.  Endocrinology managing. HTN.  Plan: Mammogram screening discussed. Self breast awareness reviewed. Pap and HR HPV collected Guidelines for Calcium, Vitamin D, regular exercise program including cardiovascular and weight bearing exercise. Refill Micronor for one year.  She will stop taking her birth control pills 2 weeks prior to her next annual exam.   Follow up annually and prn.   After visit summary provided.

## 2022-06-19 ENCOUNTER — Encounter: Payer: Self-pay | Admitting: Obstetrics and Gynecology

## 2022-06-19 ENCOUNTER — Other Ambulatory Visit (HOSPITAL_COMMUNITY)
Admission: RE | Admit: 2022-06-19 | Discharge: 2022-06-19 | Disposition: A | Discharge status: Home / Self Care | Payer: Commercial Managed Care - HMO | Source: Ambulatory Visit | Attending: Obstetrics and Gynecology | Admitting: Obstetrics and Gynecology

## 2022-06-19 ENCOUNTER — Ambulatory Visit (INDEPENDENT_AMBULATORY_CARE_PROVIDER_SITE_OTHER): Payer: Commercial Managed Care - HMO | Admitting: Obstetrics and Gynecology

## 2022-06-19 VITALS — BP 134/88 | HR 76 | Ht 65.5 in | Wt 205.0 lb

## 2022-06-19 DIAGNOSIS — Z01419 Encounter for gynecological examination (general) (routine) without abnormal findings: Secondary | ICD-10-CM | POA: Diagnosis not present

## 2022-06-19 DIAGNOSIS — Z124 Encounter for screening for malignant neoplasm of cervix: Secondary | ICD-10-CM | POA: Insufficient documentation

## 2022-06-19 MED ORDER — NORETHINDRONE 0.35 MG PO TABS: ORAL_TABLET | ORAL | 3 refills | Status: DC

## 2022-06-19 NOTE — Patient Instructions (Signed)

## 2022-06-21 ENCOUNTER — Other Ambulatory Visit: Payer: Self-pay

## 2022-06-21 DIAGNOSIS — Z3041 Encounter for surveillance of contraceptive pills: Secondary | ICD-10-CM

## 2022-06-21 NOTE — Telephone Encounter (Signed)
Last AEX 06/19/2022--recall placed for 2025. Last mammo 08/22/2021- neg birads 1  Rx sent for 14yr to alternate pharmacy. Will confirm w/ pt that we can disregard this request.   Pt confirmed that she is getting rx filled now at [facility] and we can refuse this request.   Will refuse and route to provider for final review.

## 2022-06-26 LAB — CYTOLOGY - PAP
Comment: NEGATIVE
Diagnosis: UNDETERMINED — AB
High risk HPV: NEGATIVE

## 2022-07-25 ENCOUNTER — Other Ambulatory Visit: Payer: Self-pay | Admitting: Obstetrics and Gynecology

## 2022-07-25 DIAGNOSIS — Z1231 Encounter for screening mammogram for malignant neoplasm of breast: Secondary | ICD-10-CM

## 2022-08-17 ENCOUNTER — Ambulatory Visit
Admission: RE | Admit: 2022-08-17 | Discharge: 2022-08-17 | Disposition: A | Discharge status: Home / Self Care | Payer: Commercial Managed Care - HMO | Source: Ambulatory Visit | Attending: Nurse Practitioner | Admitting: Nurse Practitioner

## 2022-08-17 ENCOUNTER — Other Ambulatory Visit: Payer: Self-pay | Admitting: Nurse Practitioner

## 2022-08-17 DIAGNOSIS — J411 Mucopurulent chronic bronchitis: Secondary | ICD-10-CM

## 2022-08-17 DIAGNOSIS — R059 Cough, unspecified: Secondary | ICD-10-CM

## 2022-08-28 ENCOUNTER — Ambulatory Visit
Admission: RE | Admit: 2022-08-28 | Discharge: 2022-08-28 | Disposition: A | Discharge status: Home / Self Care | Payer: Commercial Managed Care - HMO | Source: Ambulatory Visit | Attending: Obstetrics and Gynecology | Admitting: Obstetrics and Gynecology

## 2022-08-28 DIAGNOSIS — Z1231 Encounter for screening mammogram for malignant neoplasm of breast: Secondary | ICD-10-CM

## 2023-05-02 ENCOUNTER — Other Ambulatory Visit: Payer: Self-pay | Admitting: Obstetrics and Gynecology

## 2023-05-02 NOTE — Telephone Encounter (Signed)
 Pt now scheduled for 09/03/2023.

## 2023-05-02 NOTE — Telephone Encounter (Signed)
 Med refill request: POPs Last AEX: 06/19/2022-BS Next AEX: nothing scheduled, recall sent for 2025 per EMR. Msg sent to appt desk.  Last MMG (if hormonal med): 08/28/2022-WNL Refill authorized: rx pend.

## 2023-07-24 ENCOUNTER — Other Ambulatory Visit: Payer: Self-pay | Admitting: Obstetrics and Gynecology

## 2023-07-24 DIAGNOSIS — Z1231 Encounter for screening mammogram for malignant neoplasm of breast: Secondary | ICD-10-CM

## 2023-09-03 ENCOUNTER — Encounter: Payer: Self-pay | Admitting: Obstetrics and Gynecology

## 2023-09-03 ENCOUNTER — Ambulatory Visit (INDEPENDENT_AMBULATORY_CARE_PROVIDER_SITE_OTHER): Admitting: Obstetrics and Gynecology

## 2023-09-03 ENCOUNTER — Ambulatory Visit

## 2023-09-03 VITALS — BP 120/74 | HR 91 | Ht 66.25 in | Wt 202.0 lb

## 2023-09-03 DIAGNOSIS — N951 Menopausal and female climacteric states: Secondary | ICD-10-CM | POA: Diagnosis not present

## 2023-09-03 DIAGNOSIS — Z01419 Encounter for gynecological examination (general) (routine) without abnormal findings: Secondary | ICD-10-CM | POA: Diagnosis not present

## 2023-09-03 DIAGNOSIS — Z1331 Encounter for screening for depression: Secondary | ICD-10-CM

## 2023-09-03 NOTE — Patient Instructions (Signed)

## 2023-09-03 NOTE — Progress Notes (Signed)
 58 y.o. G59P2002 Married Caucasian female here for annual exam. Stopped taking birth control pills so she can get labs for menopause.     Skips 3 periods in the last year.  Takes progesterone only birth control pills.  Stopped her birth control pills 2 weeks ago in order to do hormone testing.   PCP: Loring Tanda Mae, MD   No LMP recorded. Patient is perimenopausal.           Sexually active: Yes.    The current method of family planning is POPs.  Menopausal hormone therapy:  n/a Exercising: Yes.    Walking  Smoker:  no  OB History  Gravida Para Term Preterm AB Living  2 2 2  0 0 2  SAB IAB Ectopic Multiple Live Births  0 0 0 0 2    # Outcome Date GA Lbr Len/2nd Weight Sex Type Anes PTL Lv  2 Term 06/1994    M CS-Classical   LIV  1 Term 11/1987    M CS-Classical   LIV     HEALTH MAINTENANCE: Last 2 paps:  06/19/22 ASCUS, HR HPV neg, 04/05/17 neg HPV neg  History of abnormal Pap or positive HPV:  no Mammogram:   08/28/22 Breast Density Cat B, BIRADS Cat 1 neg.  Has appt tomorrow.  Colonoscopy:  06/09/19 - due in 2031. Bone Density:  n/a  Result  n/a   Immunization History  Administered Date(s) Administered   Tdap 02/27/2011      reports that she has never smoked. She has never used smokeless tobacco. She reports that she does not drink alcohol and does not use drugs.  Past Medical History:  Diagnosis Date   DDD (degenerative disc disease), lumbar 11/2008   Diabetes mellitus 03/2010   Dr. Tommas   Elevated alkaline phosphatase level    Fatty liver 12/11   bx revealed--granuloma   Hyperlipidemia 07/2010   Hypertension    Hypertriglyceridemia    Polymyalgia (HCC)    Vitamin D  deficiency disease 2009    Past Surgical History:  Procedure Laterality Date   CESAREAN SECTION  10/89 & 5/96   CHOLECYSTECTOMY N/A 06/17/2021   Procedure: LAPAROSCOPIC CHOLECYSTECTOMY;  Surgeon: Signe Mitzie LABOR, MD;  Location: [facility] OR;  Service: General;  Laterality: N/A;   LUMBAR SPINE  SURGERY  11/19/2012   L4-L5 fusion   WISDOM TOOTH EXTRACTION  age 58    Current Outpatient Medications  Medication Sig Dispense Refill   amLODipine (NORVASC) 5 MG tablet Take 2.5-5 mg by mouth daily.     Biotin 800 MCG TABS Take 800 mcg by mouth daily.     calcium carbonate (OS-CAL) 600 MG TABS Take 600 mg by mouth 2 (two) times daily with a meal.     Cyanocobalamin (B-12) 5000 MCG CAPS Take 5,000 mcg by mouth daily.     lisinopril-hydrochlorothiazide (PRINZIDE,ZESTORETIC) 20-12.5 MG tablet Take 2 tablets by mouth daily.      meloxicam (MOBIC) 15 MG tablet Take 15 mg by mouth daily.     Multiple Vitamin (MULTIVITAMIN) tablet Take 1 tablet by mouth daily.     norethindrone  (JENCYCLA ) 0.35 MG tablet TAKE 1 TABLET BY MOUTH DAILY 84 tablet 1   Omega-3 Fatty Acids (FISH OIL) 1200 MG CAPS Take 1 capsule by mouth 3 (three) times daily.     potassium chloride SA (KLOR-CON) 20 MEQ tablet Take 1 tablet by mouth 2 (two) times daily.     rosuvastatin (CRESTOR) 5 MG tablet Take 5 mg  by mouth every other day.     Thiamine HCl (VITAMIN B-1) 250 MG tablet      traMADol  (ULTRAM ) 50 MG tablet Take 50 mg by mouth 2 (two) times daily.     Vitamin D , Ergocalciferol , 50 MCG (2000 UT) CAPS Take 50 mcg by mouth daily.     No current facility-administered medications for this visit.    ALLERGIES: Cephalexin, Phentermine-topiramate er, and Cephalexin  Family History  Problem Relation Age of Onset   Thyroid  disease Mother    Hypertension Mother    Coronary artery disease Father    Hypertension Sister    Diabetes Maternal Grandmother    Coronary artery disease Maternal Grandmother    Coronary artery disease Maternal Grandfather    Rheum arthritis Paternal Grandmother    Breast cancer Neg Hx     Review of Systems  All other systems reviewed and are negative.   PHYSICAL EXAM:  BP 120/74 (BP Location: Left Arm, Patient Position: Sitting)   Pulse 91   Ht 5' 6.25 (1.683 m)   Wt 202 lb (91.6 kg)    SpO2 95%   BMI 32.36 kg/m     General appearance: alert, cooperative and appears stated age Head: normocephalic, without obvious abnormality, atraumatic Neck: no adenopathy, supple, symmetrical, trachea midline and thyroid  normal.  Lungs: clear to auscultation bilaterally Breasts: normal appearance, no masses or tenderness, No nipple retraction or dimpling, No nipple discharge or bleeding, No axillary adenopathy Heart: regular rate and rhythm Abdomen: soft, non-tender; no masses, no organomegaly Extremities: extremities normal, atraumatic, no cyanosis or edema Skin: skin color, texture, turgor normal. No rashes or lesions Lymph nodes: cervical, supraclavicular, and axillary nodes normal. Neurologic: grossly normal  Pelvic: External genitalia:  no lesions              No abnormal inguinal nodes palpated.              Urethra:  normal appearing urethra with no masses, tenderness or lesions              Bartholins and Skenes: normal                 Vagina: normal appearing vagina with normal color and discharge, no lesions              Cervix: no lesions              Pap taken: no Bimanual Exam:  Uterus:  normal size, contour, position, consistency, mobility, non-tender              Adnexa: no mass, fullness, tenderness              Rectal exam: yes.  Confirms.              Anus:  normal sphincter tone, no lesions  Chaperone was present for exam:  Kari HERO, CMA  ASSESSMENT: Well woman visit with gynecologic exam. On Micronor .  Has stopped Rx for 2 weeks for hormone testing.    Hx enlarged thyroid  with left thyroid  nodule.  Endocrinology managing. HTN. PHQ-2-9: 0  PLAN: Mammogram screening discussed. Self breast awareness reviewed. Pap and HRV collected:  no.  Due in 2027.  Guidelines for Calcium, Vitamin D , regular exercise program including cardiovascular and weight bearing exercise. Medication refills:  NA Labs with endocrinology and rheumatology.  FSH and estradiol . Follow  up:  yearly and prn.

## 2023-09-04 ENCOUNTER — Ambulatory Visit
Admission: RE | Admit: 2023-09-04 | Discharge: 2023-09-04 | Disposition: A | Discharge status: Home / Self Care | Source: Ambulatory Visit | Attending: Obstetrics and Gynecology | Admitting: Obstetrics and Gynecology

## 2023-09-04 ENCOUNTER — Ambulatory Visit: Payer: Self-pay | Admitting: Obstetrics and Gynecology

## 2023-09-04 DIAGNOSIS — Z1231 Encounter for screening mammogram for malignant neoplasm of breast: Secondary | ICD-10-CM

## 2023-09-04 LAB — FOLLICLE STIMULATING HORMONE: FSH: 29.6 m[IU]/mL

## 2023-09-04 LAB — ESTRADIOL: Estradiol: <15 pg/mL

## 2023-09-09 ENCOUNTER — Ambulatory Visit: Payer: Self-pay | Admitting: Obstetrics and Gynecology

## 2023-10-07 ENCOUNTER — Other Ambulatory Visit: Payer: Self-pay | Admitting: Obstetrics and Gynecology

## 2023-10-07 NOTE — Telephone Encounter (Signed)
./  Med refill request: Meghan Lyons   Last AEX: 09/03/23 Next AEX: 09/07/24 Last MMG (if hormonal med) 09/09/23 Refill authorized: Please Advise?

## 2024-02-18 NOTE — Progress Notes (Signed)
 " Cardiology Office Note:  .   Date:  02/19/2024 ID:  Meghan Lyons, DOB 1966-01-24, MRN [id number] PCP: Loring Tanda Mae, MD [facility] Providers Cardiologist:  None   Patient Profile: .      PMH Coronary artery disease Coronary artery calcifications on CT 12/2023 Hypertension Hyperlipidemia       History of Present Illness: .   Discussed the use of AI scribe software for clinical note transcription with the patient, who gave verbal consent to proceed.  History of Present Illness Meghan Lyons is a very pleasant 58 year old female with high cholesterol and high blood pressure who presents for evaluation of coronary artery calcifications found incidentally on a CT scan. Coronary artery calcifications were incidentally discovered on a CT scan performed on January 10, 2024 that showed mild scattered atherosclerotic calcifications. She has no chest pain, shortness of breath, palpitations, or lightheadedness. History of high cholesterol and high blood pressure with generally well controlled blood pressure. She has taken rosuvastatin for nearly two years and now uses it every other day due to joint pain with daily dosing. Her triglycerides and LDL have improved, with a recent LDL of 85 mg/dL. She has a strong family history of heart disease, including coronary artery disease in her mother and maternal grandparents and in her father. She believes her cholesterol problems are hereditary because she generally eats a healthy diet avoiding red meat, fried foods, and eggs.  She admits to a fondness for sweet tea and Coke but generally only drinks 1/day.  Admits she rarely drinks water because she dislikes the taste. She works part-time as a catering manager and stays active with childcare, yard work, and housework, and plans to increase walking. She denies chest pain, shortness of breath, edema, palpitations, presyncope, syncope, orthopnea, and PND.   Family history: Her  family history includes Coronary artery disease in her father, maternal grandfather, maternal grandmother, and mother; Diabetes in her maternal grandmother; Hypertension in her mother and sister; Rheum arthritis in her paternal grandmother; Thyroid  disease in her mother.   Discussed the use of AI scribe software for clinical note transcription with the patient, who gave verbal consent to proceed.  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, yard work Time with grandchildren  No results found for: LIPOA    ROS: See HPI       Studies Reviewed: SABRA   EKG Interpretation Date/Time:  Wednesday February 19 2024 09:49:56 EST Ventricular Rate:  81 PR Interval:  138 QRS Duration:  84 QT Interval:  354 QTC Calculation: 411 R Axis:   29  Text Interpretation: Normal sinus rhythm with sinus arrhythmia Normal ECG When compared with ECG of 25-Apr-2022 09:39, No significant change was found Confirmed by [employer],  Rosaline [phone]) on 02/19/2024 9:59:05 AM      Risk Assessment/Calculations:             Physical Exam:   VS: BP 138/88 (BP Location: Left Arm, Patient Position: Sitting, Cuff Size: Large)   Pulse 81   Ht 5' 5 (1.651 m)   Wt 196 lb 3.2 oz (89 kg)   SpO2 98%   BMI 32.65 kg/m   Wt Readings from Last 3 Encounters:  02/19/24 196 lb 3.2 oz (89 kg)  09/03/23 202 lb (91.6 kg)  06/19/22 205 lb (93 kg)     GEN: Well nourished, well  developed in no acute distress NECK: No JVD; No carotid bruits CARDIAC:       RRR, no murmurs, rubs, gallops RESPIRATORY:  Clear to auscultation without rales, wheezing or rhonchi  ABDOMEN: Soft, non-tender, non-distended EXTREMITIES:  No edema; No deformity     ASSESSMENT AND PLAN: .    Assessment & Plan Coronary artery disease Cardiac risk Mild scattered atherosclerotic calcifications on renal stone CT. ASCVD risk score is 3.7% given history of hypertension and hyperlipidemia.  She also has significant family history of coronary artery disease, although not reported to be premature.  She reports she is a very active person with regular housework, yard work, and walking.  She denies chest pain, dyspnea, or other symptoms concerning for angina.  EKG reveals NSR with sinus arrhythmia.  Given age advanced coronary calcification, history of hyperlipidemia, hypertension, and family history, we will proceed with further ischemia evaluation.  LDL goal is 70 or lower.  We had a lengthy discussion about other markers that might help determine further risk. - Coronary CTA for mapping of coronary anatomy and evaluation for ischemia - Lopressor  100 mg 2 hours prior to CT - Consider starting aspirin 81 mg daily - Continue amlodipine, lisinopril-hydrochlorothiazide, rosuvastatin - Heart healthy diet avoiding processed foods, saturated fat, sugar, and other simple carbohydrates encouraged -Be as physically active as possible every day and aim for at least 150 minutes of moderate intensity exercise each week  Hyperlipidemia LDL goal < 70  Lipid panel 01/30/24 total cholesterol 164, triglycerides 197, HDL 46, LDL 85.  She has been on rosuvastatin 5 mg every other day for approximately 2 years. LDL cholesterol is at 85 mg/dL, with a goal of 70 mg/dL or lower. She is on the lowest dose of rosuvastatin every other day due to joint pain with daily use. Family history of coronary artery disease. Discussed further  lipid analysis for risk stratification. She is agreeable to undergo additional lab testing.  Additional lipid-lowering therapies were discussed including potential side effects. - Consider adding ezetimibe to further lower LDL  - Obtain LP(a), NMR, and Apo B levels for further risk stratification  Hypertension   Blood pressure is well controlled, though slightly higher than normal today, which is not unusual for appointments. Continue current antihypertensive regimen.  We are checking renal function in the setting of CT - Management per PCP - Continue amlodipine, lisinopril-hydrochlorothiazide        Dispo: 3 months with me  Signed, Rosaline Bane, NP-C "

## 2024-02-19 ENCOUNTER — Encounter (HOSPITAL_BASED_OUTPATIENT_CLINIC_OR_DEPARTMENT_OTHER): Payer: Self-pay | Admitting: Nurse Practitioner

## 2024-02-19 ENCOUNTER — Ambulatory Visit (HOSPITAL_BASED_OUTPATIENT_CLINIC_OR_DEPARTMENT_OTHER): Admitting: Nurse Practitioner

## 2024-02-19 VITALS — BP 138/88 | HR 81 | Ht 65.0 in | Wt 196.2 lb

## 2024-02-19 DIAGNOSIS — R931 Abnormal findings on diagnostic imaging of heart and coronary circulation: Secondary | ICD-10-CM | POA: Diagnosis not present

## 2024-02-19 DIAGNOSIS — I2510 Atherosclerotic heart disease of native coronary artery without angina pectoris: Secondary | ICD-10-CM | POA: Diagnosis not present

## 2024-02-19 DIAGNOSIS — I10 Essential (primary) hypertension: Secondary | ICD-10-CM

## 2024-02-19 DIAGNOSIS — Z7189 Other specified counseling: Secondary | ICD-10-CM | POA: Diagnosis not present

## 2024-02-19 DIAGNOSIS — E785 Hyperlipidemia, unspecified: Secondary | ICD-10-CM

## 2024-02-19 MED ORDER — METOPROLOL TARTRATE 100 MG PO TABS: 100.0000 mg | ORAL_TABLET | Freq: Once | ORAL | 0 refills | Status: DC

## 2024-02-19 NOTE — Patient Instructions (Signed)
 Medication Instructions:   Your physician recommends that you continue on your current medications as directed. Please refer to the Current Medication list given to you today.   *If you need a refill on your cardiac medications before your next appointment, please call your pharmacy*  Lab Work:  Your physician recommends that you return for a FASTING lpa/cmet/apolipoprotein b/nmr, fasting after midnight. Patient given paperwork today.    If you have labs (blood work) drawn today and your tests are completely normal, you will receive your results only by: MyChart Message (if you have MyChart) OR A paper copy in the mail If you have any lab test that is abnormal or we need to change your treatment, we will call you to review the results.  Testing/Procedures:    Your cardiac CT will be scheduled at one of the below locations:   [facility] [address]  [place], [place] [address]   If scheduled at the [facility] at [address], please enter the parking lot using the [address] entrance and use the FREE valet service at the patient drop-off area. Enter the building and check-in with registration on the main floor.  Please follow these instructions carefully (unless otherwise directed):  An IV will be required for this test and Nitroglycerin will be given.    On the Night Before the Test: Be sure to Drink plenty of water. Do not consume any caffeinated/decaffeinated beverages or chocolate 12 hours prior to your test. Do not take any antihistamines 12 hours prior to your test.   On the Day of the Test: Drink plenty of water until 1 hour prior to the test. Do not eat any food 1 hour prior to test. You may take your regular medications prior to the test.  Take metoprolol  (Lopressor ) two hours prior to test. If you take lisinopril/Hydrochlorothiazide, please HOLD on the morning of the test.You can take when you get  home. FEMALES- please wear underwire-free bra if available, avoid dresses & tight clothing      After the Test: Drink plenty of water. After receiving IV contrast, you may experience a mild flushed feeling. This is normal. On occasion, you may experience a mild rash up to 24 hours after the test. This is not dangerous. If this occurs, you can take Benadryl 25 mg, Zyrtec, Claritin, or Allegra and increase your fluid intake. (Patients taking Tikosyn should avoid Benadryl, and may take Zyrtec, Claritin, or Allegra) If you experience trouble breathing, this can be serious. If it is severe call 911 IMMEDIATELY. If it is mild, please call our office.  We will call to schedule your test 2-4 weeks out understanding that some insurance companies will need an authorization prior to the service being performed.   For more information and frequently asked questions, please visit our website : [email or web address]  For non-scheduling related questions, please contact the cardiac imaging nurse navigator should you have any questions/concerns: Cardiac Imaging Nurse Navigators Direct Office Dial: [phone]   For scheduling needs, including cancellations and rescheduling, please call Brittany, [phone].   Follow-Up: At [facility], you and your health needs are our priority.  As part of our continuing mission to provide you with exceptional heart care, our providers are all part of one team.  This team includes your primary Cardiologist (physician) and Advanced Practice Providers or APPs (Physician Assistants and Nurse Practitioners) who all work together to provide you with the care you need, when you need  it.  Your next appointment:   3 month(s)  Provider:   Rosaline Bane, NP    We recommend signing up for the patient portal called MyChart.  Sign up information is provided on this After Visit Summary.  MyChart is used to connect with patients for Virtual Visits  (Telemedicine).  Patients are able to view lab/test results, encounter notes, upcoming appointments, etc.  Non-urgent messages can be sent to your provider as well.   To learn more about what you can do with MyChart, go to [email or web address].   Other Instructions  Adopting a Healthy Lifestyle.   Weight: Know what a healthy weight is for you (roughly BMI <25) and aim to maintain this. You can calculate your body mass index on your smart phone. Unfortunately, this is not the most accurate measure of healthy weight, but it is the simplest measurement to use. A more accurate measurement involves body scanning which measures lean muscle, fat tissue and bony density. We do not have this equipment at [facility].    Diet: Aim for 7+ servings of fruits and vegetables daily Limit animal fats in diet for cholesterol and heart health - choose grass fed whenever available Avoid highly processed foods (fast food burgers, tacos, fried chicken, pizza, hot dogs, french fries)  Saturated fat comes in the form of butter, lard, coconut oil, margarine, partially hydrogenated oils, dairy products, and fat in meat. These increase your risk of cardiovascular disease.  Use healthy plant oils, such as olive, canola, soy, corn, sunflower and peanut.  Whole foods such as fruits, vegetables and whole grains have fiber  Men need > 38 grams of fiber per day Women need > 25 grams of fiber per day  Load up on vegetables and fruits - one-half of your plate: Aim for color and variety, and remember that potatoes dont count. Go for whole grains - one-quarter of your plate: Whole wheat, barley, wheat berries, quinoa, oats, brown rice, and foods made with them. If you want pasta, go with whole wheat pasta. Protein power - one-quarter of your plate: Fish, chicken, beans, and nuts are all healthy, versatile protein sources. Limit red meat. You need carbohydrates for energy! The type of carbohydrate is more important than the  amount. Choose carbohydrates such as vegetables, fruits, whole grains, beans, and nuts in the place of white rice, white pasta, potatoes (baked or fried), macaroni and cheese, cakes, cookies, and donuts.  If youre thirsty, drink water. Coffee and tea are good in moderation, but skip sugary drinks and limit milk and dairy products to one or two daily servings. Keep sugar intake at 6 teaspoons or 24 grams or LESS       Exercise: Aim for 150 min of moderate intensity exercise weekly for heart health, and weights twice weekly for bone health Stay active - any steps are better than no steps! Aim for 7-9 hours of sleep daily   Sleep: This provides your body with the reset and relaxation that it needs!  Aim to get 7-8 hours of sleep each night. Limit caffeine, screen time, and other distractions prior to bedtime.  Keep your bedroom cool and dark and do not wear heavy clothing to bed or use heavy bed covers - layer if needed.

## 2024-03-03 ENCOUNTER — Other Ambulatory Visit (HOSPITAL_BASED_OUTPATIENT_CLINIC_OR_DEPARTMENT_OTHER): Payer: Self-pay | Admitting: *Deleted

## 2024-03-03 ENCOUNTER — Telehealth (HOSPITAL_BASED_OUTPATIENT_CLINIC_OR_DEPARTMENT_OTHER): Payer: Self-pay | Admitting: *Deleted

## 2024-03-03 DIAGNOSIS — I2510 Atherosclerotic heart disease of native coronary artery without angina pectoris: Secondary | ICD-10-CM

## 2024-03-03 DIAGNOSIS — E785 Hyperlipidemia, unspecified: Secondary | ICD-10-CM

## 2024-03-03 DIAGNOSIS — R931 Abnormal findings on diagnostic imaging of heart and coronary circulation: Secondary | ICD-10-CM

## 2024-03-03 DIAGNOSIS — Z7189 Other specified counseling: Secondary | ICD-10-CM

## 2024-03-03 NOTE — Telephone Encounter (Signed)
 Lauren from [facility] called to stated NMR tubes are being recalled and can no longer be drawn.  Did not know end date.Stated I needed to place new orders for labs and take off the NMR. Placed new orders after t/w Rosaline and replaced lab with Lipid.  New orders placed and released.  Burnard Cha, RN supervisor t/w higher up at [facility] and stated NMR's are not being recalled the tubes are being replaced and are on back order.   Waiting to hear for a date when tubes will come in. T/w Rosaline and if come across this situation again will replace NMR with lipid panel.

## 2024-03-04 LAB — COMPREHENSIVE METABOLIC PANEL WITH GFR
ALT: 29 IU/L (ref 0–32)
AST: 24 IU/L (ref 0–40)
Albumin: 4 g/dL (ref 3.8–4.9)
Alkaline Phosphatase: 158 IU/L — ABNORMAL HIGH (ref 49–135)
BUN/Creatinine Ratio: 31 — ABNORMAL HIGH (ref 9–23)
BUN: 26 mg/dL — ABNORMAL HIGH (ref 6–24)
Bilirubin Total: 0.3 mg/dL (ref 0.0–1.2)
CO2: 24 mmol/L (ref 20–29)
Calcium: 9.4 mg/dL (ref 8.7–10.2)
Chloride: 101 mmol/L (ref 96–106)
Creatinine, Ser: 0.84 mg/dL (ref 0.57–1.00)
Globulin, Total: 3.1 g/dL (ref 1.5–4.5)
Glucose: 80 mg/dL (ref 70–99)
Potassium: 4.7 mmol/L (ref 3.5–5.2)
Sodium: 140 mmol/L (ref 134–144)
Total Protein: 7.1 g/dL (ref 6.0–8.5)
eGFR: 81 mL/min/1.73

## 2024-03-04 LAB — APOLIPOPROTEIN B: Apolipoprotein B: 79 mg/dL

## 2024-03-04 LAB — LIPID PANEL
Chol/HDL Ratio: 3 ratio (ref 0.0–4.4)
Cholesterol, Total: 181 mg/dL (ref 100–199)
HDL: 61 mg/dL
LDL Chol Calc (NIH): 86 mg/dL (ref 0–99)
Triglycerides: 203 mg/dL — ABNORMAL HIGH (ref 0–149)
VLDL Cholesterol Cal: 34 mg/dL (ref 5–40)

## 2024-03-04 LAB — LIPOPROTEIN A (LPA): Lipoprotein (a): 151.3 nmol/L — ABNORMAL HIGH

## 2024-03-05 ENCOUNTER — Ambulatory Visit (HOSPITAL_BASED_OUTPATIENT_CLINIC_OR_DEPARTMENT_OTHER): Payer: Self-pay | Admitting: Nurse Practitioner

## 2024-03-09 ENCOUNTER — Encounter (HOSPITAL_COMMUNITY): Payer: Self-pay

## 2024-03-11 ENCOUNTER — Ambulatory Visit (HOSPITAL_BASED_OUTPATIENT_CLINIC_OR_DEPARTMENT_OTHER)
Admission: RE | Admit: 2024-03-11 | Discharge: 2024-03-11 | Disposition: A | Discharge status: Home / Self Care | Source: Ambulatory Visit | Attending: Cardiology | Admitting: Cardiology

## 2024-03-11 ENCOUNTER — Other Ambulatory Visit: Payer: Self-pay | Admitting: Cardiology

## 2024-03-11 ENCOUNTER — Ambulatory Visit (HOSPITAL_COMMUNITY)
Admission: RE | Admit: 2024-03-11 | Discharge: 2024-03-11 | Disposition: A | Discharge status: Home / Self Care | Payer: Self-pay | Source: Ambulatory Visit | Attending: Cardiology | Admitting: Cardiology

## 2024-03-11 ENCOUNTER — Ambulatory Visit (HOSPITAL_BASED_OUTPATIENT_CLINIC_OR_DEPARTMENT_OTHER): Payer: Self-pay | Admitting: Nurse Practitioner

## 2024-03-11 DIAGNOSIS — R931 Abnormal findings on diagnostic imaging of heart and coronary circulation: Secondary | ICD-10-CM | POA: Diagnosis not present

## 2024-03-11 DIAGNOSIS — I2510 Atherosclerotic heart disease of native coronary artery without angina pectoris: Secondary | ICD-10-CM

## 2024-03-11 MED ORDER — IOHEXOL 350 MG/ML SOLN
100.0000 mL | Freq: Once | INTRAVENOUS | Status: AC | PRN
  Administered 2024-03-11: 100 mL via INTRAVENOUS

## 2024-03-11 MED ORDER — NITROGLYCERIN 0.4 MG SL SUBL
0.8000 mg | SUBLINGUAL_TABLET | Freq: Once | SUBLINGUAL | Status: AC
  Administered 2024-03-11: 0.8 mg via SUBLINGUAL

## 2024-03-28 NOTE — Progress Notes (Unsigned)
 " Cardiology Office Note:  .   Date:  04/02/2024 ID:  Meghan Lyons, DOB May 20, 1965, MRN [id number] PCP: Loring Tanda Mae, MD [facility] Providers Cardiologist:  None   Patient Profile: .      PMH Coronary artery disease Coronary artery calcifications on CT 12/2023 CCTA 03/11/2024 CAC 0 Severe stenosis distal LCx Hypertension Hyperlipidemia Elevated LP(a)    Referred to cardiology and seen by me on 02/19/24 for evaluation of coronary artery calcifications found incidentally on a CT scan CT scan performed on January 10, 2024 that showed mild scattered atherosclerotic calcifications. She has no chest pain, shortness of breath, palpitations, or lightheadedness. History of high cholesterol and high blood pressure with generally well controlled BP. On rosuvastatin for nearly two years and now uses it every other day due to joint pain with daily dosing. Triglycerides and LDL have improved, with a recent LDL of 85 mg/dL. Strong family history of heart disease, including coronary artery disease in her mother and maternal grandparents and in her father. Believes high cholesterol is hereditary because she generally eats a healthy diet avoiding red meat, fried foods, and eggs. Admits to a fondness for sweet tea and Coke but generally only drinks 1/day. Admits she rarely drinks water because she dislikes the taste. Works part-time as a catering manager and stays active with childcare, yard work, and housework, and plans to increase walking. No chest pain, shortness of breath, edema, palpitations, presyncope, syncope, orthopnea, and PND. ASCVD Risk score 4.4%. Given significant calcification, family history and additional risk factors, coronary CTA was obtained.   CCTA 03/11/24 revealed CAC Score of 0, severe atherosclerosis (70-99%) soft plaque in mid to distal LCx with CT FFR 0.78 distally. Flow in proximal and mid LCx is normal.      History of Present Illness: .   Discussed the use of AI  scribe software for clinical note transcription with the patient, who gave verbal consent to proceed.  History of Present Illness Meghan Lyons is a very pleasant 59 year old female who presents for follow-up of coronary artery disease. She is worried about a recent test showing soft, non-calcified plaque in the circumflex artery.  We reviewed the results of her CCTA in detail. 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 Her LDL is 87 mg/dL. Her lipoprotein(a) is 151.3 mg/dL, which is elevated. Family history is notable for significant heart disease, including a grandmother who had quadruple bypass and a father treated medically for heart problems. She stays active with yard work, follows a generally healthy diet with occasional indulgences, and feels well overall but is more anxious since learning about the plaque findings.  She denies chest pain, dyspnea, orthopnea, PND, palpitations, presyncope, syncope.  No concerning cardiac symptoms with exertion.    Lipoprotein (a)  Date/Time Value Ref Range Status  03/03/2024 10:08 AM 151.3 (H) <75.0 nmol/L Final    Comment:    Note:  Values greater than or equal to 75.0 nmol/L may        indicate an independent risk factor for CHD,        but must be evaluated with caution when applied        to non-Caucasian populations due to the        influence of genetic factors on Lp(a) across        ethnicities.  ROS: See HPI       Studies Reviewed: .          Risk Assessment/Calculations:             Physical Exam:   VS: BP 132/76 (BP Location: Left Arm, Patient Position: Sitting, Cuff Size: Normal)   Pulse 91   Ht 5' 5 (1.651 m)   Wt 191 lb 9.6 oz (86.9 kg)   SpO2 99%   BMI 31.88 kg/m   Wt Readings from Last 3 Encounters:  04/02/24 191 lb 9.6 oz (86.9 kg)  02/19/24 196 lb 3.2 oz (89 kg)   09/03/23 202 lb (91.6 kg)     GEN: Well nourished, well developed in no acute distress NECK: No JVD; No carotid bruits CARDIAC:       RRR, no murmurs, rubs, gallops RESPIRATORY:  Clear to auscultation without rales, wheezing or rhonchi  ABDOMEN: Soft, non-tender, non-distended EXTREMITIES:  No edema; No deformity     ASSESSMENT AND PLAN: .    Assessment & Plan Coronary artery disease Cardiac risk CCTA was completed 03/11/24 and revealed possibly hemodynamically significant soft plaque in mid to distal LCx with no stenosis in any of the other coronary arteries. Favor medical therapy.  We do lengthy discussion about medical therapy as well as red flag signs to report.  She is physically active and denies chest pain, dyspnea, or other symptoms concerning for angina.  No indication for further ischemia evaluation at this time.  LDL 86 mg/dL on 09/29/7971, not at goal on low-dose rosuvastatin 3 days/week. Favor starting PCSK9 inhibitor therapy if financially feasible. - Continue aspirin, amlodipine, rosuvastatin -Check pricing of Repatha  and if feasible start Repatha  140 mg every 14 days - Heart healthy diet avoiding processed foods, saturated fat, sugar, and other simple carbohydrates encouraged -Be as physically active as possible every day and aim for at least 150 minutes of moderate intensity exercise each week  Hyperlipidemia LDL goal < 55 Elevated lipoprotein a LPa elevated at 151.3 nmol/L. Lipid panel completed 03/03/2024 with total cholesterol 181, triglycerides 203, HDL 61, LDL-C 86.  LDL not at goal on low-dose rosuvastatin.  She is tolerating this dose without concerning side effects but is concerned about side effects associated with higher dose.  Lengthy discussion about the importance of LDL 55 or lower.  APO B is 79 which is in the acceptable range however goal is 70 or lower. - Seek approval for PCSK9 inhibitor therapy - Continue rosuvastatin 5 mg every other day - Continue heart  healthy diet and regular exercise  Hypertension   Blood pressure is well controlled but tends to be a little higher in the office.  No change in antihypertensive therapy today.  Renal function stable on labs completed 03/03/2024.- Management per PCP - Continue amlodipine, lisinopril-hydrochlorothiazide       Dispo: 1 year with me  Signed, Rosaline Bane, NP-C "

## 2024-04-02 ENCOUNTER — Encounter (HOSPITAL_BASED_OUTPATIENT_CLINIC_OR_DEPARTMENT_OTHER): Payer: Self-pay | Admitting: Nurse Practitioner

## 2024-04-02 ENCOUNTER — Ambulatory Visit (HOSPITAL_BASED_OUTPATIENT_CLINIC_OR_DEPARTMENT_OTHER): Admitting: Nurse Practitioner

## 2024-04-02 VITALS — BP 132/76 | HR 91 | Ht 65.0 in | Wt 191.6 lb

## 2024-04-02 DIAGNOSIS — R931 Abnormal findings on diagnostic imaging of heart and coronary circulation: Secondary | ICD-10-CM

## 2024-04-02 DIAGNOSIS — E785 Hyperlipidemia, unspecified: Secondary | ICD-10-CM

## 2024-04-02 DIAGNOSIS — I2510 Atherosclerotic heart disease of native coronary artery without angina pectoris: Secondary | ICD-10-CM

## 2024-04-02 DIAGNOSIS — I10 Essential (primary) hypertension: Secondary | ICD-10-CM

## 2024-04-02 DIAGNOSIS — Z7189 Other specified counseling: Secondary | ICD-10-CM | POA: Diagnosis not present

## 2024-04-02 DIAGNOSIS — E7841 Elevated Lipoprotein(a): Secondary | ICD-10-CM | POA: Diagnosis not present

## 2024-04-02 MED ORDER — REPATHA SURECLICK 140 MG/ML ~~LOC~~ SOAJ: 140.0000 mg | SUBCUTANEOUS | 3 refills | Status: AC

## 2024-04-02 NOTE — Patient Instructions (Signed)
 Medication Instructions:   START Repatha  Inject 140 mg into the skin every 14 (fourteen) days. - Subcutaneous   *If you need a refill on your cardiac medications before your next appointment, please call your pharmacy*  Lab Work:  Your physician recommends that you return for a FASTING NMR/LPA/APOLIPPROTEIN B, fasting after midnight. Patient given paperwork today.    If you have labs (blood work) drawn today and your tests are completely normal, you will receive your results only by: MyChart Message (if you have MyChart) OR A paper copy in the mail If you have any lab test that is abnormal or we need to change your treatment, we will call you to review the results.  Testing/Procedures:  None ordered.  Follow-Up: At [facility], you and your health needs are our priority.  As part of our continuing mission to provide you with exceptional heart care, our providers are all part of one team.  This team includes your primary Cardiologist (physician) and Advanced Practice Providers or APPs (Physician Assistants and Nurse Practitioners) who all work together to provide you with the care you need, when you need it.  Your next appointment:   6 month(s)  Provider:   Rosaline Bane, NP    We recommend signing up for the patient portal called MyChart.  Sign up information is provided on this After Visit Summary.  MyChart is used to connect with patients for Virtual Visits (Telemedicine).  Patients are able to view lab/test results, encounter notes, upcoming appointments, etc.  Non-urgent messages can be sent to your provider as well.   To learn more about what you can do with MyChart, go to [email or web address].   Other Instructions  Your physician wants you to follow-up in: 6 months.  You will receive a reminder letter in the mail two months in advance. If you don't receive a letter, please call our office to schedule the follow-up appointment.

## 2024-04-03 ENCOUNTER — Telehealth: Payer: Self-pay | Admitting: Pharmacy Technician

## 2024-04-03 NOTE — Telephone Encounter (Signed)
" ° °  Pharmacy Patient Advocate Encounter   Received notification from [facility] that prior authorization for REPATHA  is required/requested.   Insurance verification completed.   The patient is insured through [facility]   Per test claim: PA required; PA submitted to above mentioned insurance via Latent Key/confirmation #/EOC [facility] Status is pending  "

## 2024-05-14 ENCOUNTER — Ambulatory Visit (HOSPITAL_BASED_OUTPATIENT_CLINIC_OR_DEPARTMENT_OTHER): Admitting: Nurse Practitioner

## 2024-09-07 ENCOUNTER — Ambulatory Visit: Admitting: Obstetrics and Gynecology
